# Patient Record
Sex: Female | Born: 1970 | Race: White | Hispanic: No | Marital: Married | Smoking: Never smoker
Health system: Southern US, Community
[De-identification: ages and names within clinical notes are randomized; demographics above are authoritative.]

---

## 1997-08-16 ENCOUNTER — Other Ambulatory Visit: Admission: RE | Admit: 1997-08-16 | Discharge: 1997-08-16 | Payer: Self-pay | Admitting: Obstetrics and Gynecology

## 1998-03-09 ENCOUNTER — Inpatient Hospital Stay (HOSPITAL_COMMUNITY): Admission: AD | Admit: 1998-03-09 | Discharge: 1998-03-12 | Payer: Self-pay | Admitting: Obstetrics and Gynecology

## 1998-03-22 ENCOUNTER — Encounter (HOSPITAL_COMMUNITY): Admission: RE | Admit: 1998-03-22 | Discharge: 1998-06-20 | Payer: Self-pay | Admitting: *Deleted

## 1998-04-16 ENCOUNTER — Other Ambulatory Visit: Admission: RE | Admit: 1998-04-16 | Discharge: 1998-04-16 | Payer: Self-pay | Admitting: *Deleted

## 1999-06-16 ENCOUNTER — Other Ambulatory Visit: Admission: RE | Admit: 1999-06-16 | Discharge: 1999-06-16 | Payer: Self-pay | Admitting: Obstetrics and Gynecology

## 2000-07-09 ENCOUNTER — Other Ambulatory Visit: Admission: RE | Admit: 2000-07-09 | Discharge: 2000-07-09 | Payer: Self-pay | Admitting: Obstetrics and Gynecology

## 2000-09-26 ENCOUNTER — Encounter: Payer: Self-pay | Admitting: Family Medicine

## 2000-09-26 ENCOUNTER — Encounter: Admission: RE | Admit: 2000-09-26 | Discharge: 2000-09-26 | Payer: Self-pay | Admitting: Family Medicine

## 2000-10-18 ENCOUNTER — Encounter: Payer: Self-pay | Admitting: Neurosurgery

## 2000-10-18 ENCOUNTER — Inpatient Hospital Stay (HOSPITAL_COMMUNITY): Admission: RE | Admit: 2000-10-18 | Discharge: 2000-10-19 | Payer: Self-pay | Admitting: Neurosurgery

## 2001-05-17 ENCOUNTER — Other Ambulatory Visit: Admission: RE | Admit: 2001-05-17 | Discharge: 2001-05-17 | Payer: Self-pay | Admitting: Obstetrics and Gynecology

## 2001-10-19 ENCOUNTER — Ambulatory Visit (HOSPITAL_COMMUNITY): Admission: RE | Admit: 2001-10-19 | Discharge: 2001-10-19 | Payer: Self-pay | Admitting: Gastroenterology

## 2001-10-19 ENCOUNTER — Encounter: Payer: Self-pay | Admitting: Gastroenterology

## 2001-11-24 ENCOUNTER — Observation Stay (HOSPITAL_COMMUNITY): Admission: RE | Admit: 2001-11-24 | Discharge: 2001-11-25 | Payer: Self-pay | Admitting: Surgery

## 2001-11-24 ENCOUNTER — Encounter (INDEPENDENT_AMBULATORY_CARE_PROVIDER_SITE_OTHER): Payer: Self-pay | Admitting: Specialist

## 2001-11-24 ENCOUNTER — Encounter: Payer: Self-pay | Admitting: Surgery

## 2002-01-09 ENCOUNTER — Other Ambulatory Visit: Admission: RE | Admit: 2002-01-09 | Discharge: 2002-01-09 | Payer: Self-pay | Admitting: Obstetrics and Gynecology

## 2002-05-25 ENCOUNTER — Ambulatory Visit (HOSPITAL_COMMUNITY): Admission: RE | Admit: 2002-05-25 | Discharge: 2002-05-25 | Payer: Self-pay | Admitting: Gastroenterology

## 2002-05-25 ENCOUNTER — Encounter: Payer: Self-pay | Admitting: Gastroenterology

## 2002-08-15 ENCOUNTER — Other Ambulatory Visit: Admission: RE | Admit: 2002-08-15 | Discharge: 2002-08-15 | Payer: Self-pay | Admitting: Obstetrics and Gynecology

## 2003-08-28 ENCOUNTER — Other Ambulatory Visit: Admission: RE | Admit: 2003-08-28 | Discharge: 2003-08-28 | Payer: Self-pay | Admitting: Obstetrics and Gynecology

## 2004-09-02 ENCOUNTER — Other Ambulatory Visit: Admission: RE | Admit: 2004-09-02 | Discharge: 2004-09-02 | Payer: Self-pay | Admitting: Obstetrics and Gynecology

## 2004-10-23 ENCOUNTER — Encounter: Admission: RE | Admit: 2004-10-23 | Discharge: 2004-10-23 | Payer: Self-pay | Admitting: Obstetrics and Gynecology

## 2004-12-08 ENCOUNTER — Encounter: Admission: RE | Admit: 2004-12-08 | Discharge: 2004-12-08 | Payer: Self-pay | Admitting: Otolaryngology

## 2005-03-27 ENCOUNTER — Encounter: Admission: RE | Admit: 2005-03-27 | Discharge: 2005-03-27 | Payer: Self-pay | Admitting: Otolaryngology

## 2006-05-24 ENCOUNTER — Encounter: Admission: RE | Admit: 2006-05-24 | Discharge: 2006-05-24 | Payer: Self-pay | Admitting: Family Medicine

## 2007-02-04 ENCOUNTER — Ambulatory Visit (HOSPITAL_COMMUNITY): Admission: RE | Admit: 2007-02-04 | Discharge: 2007-02-04 | Payer: Self-pay | Admitting: Obstetrics and Gynecology

## 2008-01-26 ENCOUNTER — Encounter: Admission: RE | Admit: 2008-01-26 | Discharge: 2008-01-26 | Payer: Self-pay | Admitting: Family Medicine

## 2009-01-24 ENCOUNTER — Encounter: Admission: RE | Admit: 2009-01-24 | Discharge: 2009-01-24 | Payer: Self-pay | Admitting: *Deleted

## 2010-05-20 NOTE — H&P (Signed)
[name], [name]              ACCOUNT NO.:  [id number]   MEDICAL RECORD NO.:  [id number]          PATIENT TYPE:  AMB   LOCATION:  SDC                           FACILITY:  [facility]   PHYSICIAN:  Duke Salvia. Marcelle Overlie, M.D.DATE OF BIRTH:  [birth year]   DATE OF ADMISSION:  DATE OF DISCHARGE:                              HISTORY & PHYSICAL   DATE OF SCHEDULED SURGERY:  February 04, 2007, at [facility].   CHIEF COMPLAINT:  1. Pelvic pain.  2. Dyspareunia.   HPI:  A 40 year old G1, P1 is currently on Loestrin 24 Fe.  She has a  long history of vague pelvic pain and worsening dyspareunia that has  become more pronounced over the last 6 months despite being on oral  contraceptives.  Of note, she has a sister who has endometriosis.  Due  to continued problems with pain, she presents now for diagnostic  laparoscopy.  This procedure including risk of bleeding, infection,  adjacent organ injury, possible need for open or additional surgery all  reviewed with her which she understands and accepts.  Laparoscopic  treatments and hormonal treatments for endometriosis likewise reviewed.   PAST MEDICAL HISTORY:   CURRENT MEDICATIONS:  1. Topamax.  2. Effexor.  3. Valtrex p.r.n.  4. Loestrin Fe.  5. Vicoprofen p.r.n. 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  She has a history of  migraine headache.   FAMILY HISTORY:  Significant for grandmother who has had a stroke.   PHYSICAL EXAM:  Temperature 98.2.  Blood pressure 128/70.  HEENT:  Unremarkable.  NECK:  Supple without masses.  LUNGS:  Clear.  CARDIOVASCULAR:  Regular rate and rhythm without murmurs, rubs, or  gallops.  BREASTS:  Without masses.  ABDOMEN:  Soft, flat, and nontender.  PELVIC EXAM:  Normal external genitalia.  Vagina, cervix clear.  Uterus  normal position, normal size.  Adnexa negative.  No unusual nodularity  or tenderness.   IMPRESSION:  1. Chronic pelvic pain.  2. Dyspareunia.   PLAN:  Diagnostic laparoscopy procedure and risks reviewed as above.      Richard M. Marcelle Overlie, M.D.  Electronically Signed     RMH/MEDQ  D:  01/29/2007  T:  01/30/2007  Job:  [id number]

## 2010-05-20 NOTE — Op Note (Signed)
[name], [name]              ACCOUNT NO.:  [id number]   MEDICAL RECORD NO.:  [id number]          PATIENT TYPE:  AMB   LOCATION:  SDC                           [facility]  [facility]   PHYSICIAN:  Duke Salvia. Marcelle Overlie, M.D.DATE OF BIRTH:  02-15-1970   DATE OF PROCEDURE:  02/04/2007  DATE OF DISCHARGE:                               OPERATIVE REPORT   PREOPERATIVE DIAGNOSIS:  Pelvic pain, dyspareunia.   POSTOPERATIVE DIAGNOSIS:  Pelvic pain, dyspareunia, possible minimal  left uterosacral endometriosis, right mid and upper abdomen adhesions  secondary to her prior cholecystectomy.   SURGEON:  Duke Salvia. Marcelle Overlie, M.D.   ANESTHESIA:  General.   COMPLICATIONS:  None.   DRAINS:  In and out Foley catheter.   BLOOD LOSS:  Minimal.   SPECIMENS REMOVED:  None.   PROCEDURE AND FINDINGS:  The patient was taken to the operating room.  After an adequate level of general anesthesia was obtained with the  patient's legs in stirrups the abdomen, perineum and vagina were prepped  and draped in the usual manner for laparoscopy.  The bladder was  drained.  EUA carried out.  Uterus mid position, normal size, mobile,  adnexa negative.  Hulka tenaculum positioned.  Attention directed to the  abdomen.  The subumbilical area was infiltrated with half percent  Marcaine plain.  A small incision was made.  The Veress needle  introduced without difficulty.  Its intra-abdominal position verified by  pressure and water testing.  After a 2.5 liter pneumoperitoneum was then  created laparoscopic trocar and sleeve were then introduced without  difficulty.  There was no evidence of any bleeding or trauma.   Three fingerbreadths above the symphysis in the midline a 5 mm trocar  was inserted and a blunt probe was positioned.  The patient was then  placed in Trendelenburg and the pelvic findings as follows.  The  anterior and posterior cul-de-sac were unremarkable.  The uterus itself  was normal size.  The serosa  was normal.  Bilateral adnexal and broad  ligament areas were unremarkable.  There was one peritoneal window  lateral to the left uterosacral ligament that may have represented an  area of old minimal endometriosis but no other active disease noted.  The appendix was unremarkable.  The upper abdomen did have adhesions  from omentum and small bowel to the right mid and right upper abdomen  from her prior laparoscopic cholecystectomy.  This area of adhesions was  extensive and was not lysed.   The pelvic findings were photo documented, instruments were removed, gas  allowed to escape.  Defects were closed with 4-0 Dexon subcuticular  sutures and Dermabond.  She tolerated this well, went to recovery room  in good condition.      Richard M. Marcelle Overlie, M.D.  Electronically Signed     RMH/MEDQ  D:  02/04/2007  T:  02/04/2007  Job:  [id number]

## 2010-05-23 NOTE — Op Note (Signed)
[name], [name]                        ACCOUNT NO.:  [id number]   MEDICAL RECORD NO.:  [id number]                   PATIENT TYPE:  AMB   LOCATION:  DAY                                  FACILITY:  [facility]   PHYSICIAN:  Currie Paris, M.D.           DATE OF BIRTH:  Jul 24, 1970   DATE OF PROCEDURE:  11/24/2001  DATE OF DISCHARGE:                                 OPERATIVE REPORT   CCS#:  [id number]   PREOPERATIVE DIAGNOSES:  Biliary dyskinesia.   POSTOPERATIVE DIAGNOSES:  Biliary dyskinesia.   OPERATION:  Laparoscopic cholecystectomy with operative cholangiogram.   SURGEON:  Currie Paris, M.D.   ASSISTANT:  Lorelee New, M.D.   ANESTHESIA:  General endotracheal.   CLINICAL HISTORY:  This patient is a 40 year old with multiple GI symptoms  but has recently been having biliary type symptoms and workup showed  markedly abnormal ejection fraction with CCK injection and we elected to  proceed to a laparoscopic cholecystectomy.   DESCRIPTION OF PROCEDURE:  The patient was seen in the holding area and had  no further questions. She was taken to the operating room and after  satisfactory general anesthesia had been obtained, the abdomen was prepped  and draped. The 0.25% plain Marcaine was used for local and the umbilical  incision was made, the fascia opened and the peritoneal cavity entered under  direct vision. A pursestring was placed, the Hasson introduced and the  abdomen insufflated to 15.   At the patient's request, we took a peak at the pelvic area and could see  what appeared to be normal upper surface of the uterus as well as what  appeared to be a normal left ovary but I could not really visualize the  right ovary as the cecum was lying down and we could not well mobilize that  off. There was no gross evidence of endometriosis but there was a small  amount of free blood in the pelvis which could have been preexisting or  could have been a small amount of  leakage from the umbilical incision as we  were entering. This was probably in the order of about 10 cc.   Additional cannulas were placed and the patient put in reverse  Trendelenburg. The peritoneum over the cystic duct was opened and there was  what appeared to be a little bit of a bigger size cystic duct and we could  easily see the common duct. I made a big window and could see the anterior  branch of the cystic artery coming up on the gallbladder and it freed up the  peritoneum on either side of the gallbladder so I got good mobility. Once I  had adequate length on the cystic duct, I put a clip on it near the junction  of the gallbladder and opened it for the cholangiogram. The duct wall itself  was markedly thickened with a relatively small lumen. I was able  to  cannulate it fairly readily and operative cholangiography was done. To me  the cholangiogram appeared normal with filling of the hepatic ducts, a  fairly long cystic duct and filling into the duodenum although it did look a  little tight at the distal common duct. There were no filling defects noted.   The cystic duct catheter was removed and four clips placed on the cystic  duct and it was divided. The anterior branch of the cystic artery had clips  placed on it and it was divided with two clips on the stay side. A little  further work identified the posterior branch of the cystic artery which was  clipped in a similar fashion and divided and the gallbladder then removed  from below to above with coagulation current of the cautery.   Just prior to disconnecting, we irrigated and made sure everything was dry  and then disconnected the gallbladder and brought it out the umbilical port.   The abdomen was reinsufflated and final irrigation and check for hemostasis  was made and again everything appeared dry. The lateral ports were removed  under direct vision, there was no bleeding. The umbilical port was removed  and the  pursestring tied down leaving the camera in the epigastric port to  be sure that that went okay. The abdomen was deflated through the epigastric  port and all skin incisions were then closed with 4-0 Monocryl subcuticular  plus Steri-Strips.   The patient tolerated the procedure well. There were no operative  complications. All counts were correct.                                                Currie Paris, M.D.    CJS/MEDQ  D:  11/24/2001  T:  11/24/2001  Job:  [id number]   cc:   Vale Haven. Andrey Campanile, M.D.  [address]  [place]  [place] [address]  Fax: [phone]   Vania Rea. Jarold Motto, M.D. [facility]  [address]  [place]  [place] [address]  Fax: [phone]

## 2010-05-23 NOTE — Op Note (Signed)
[facility]  Patient:    LUS, KRIEGEL Visit Number: [id number] MRN: [id number]          Service Type: SUR Location: [facility] 3172 02 Attending Physician:  Barton Fanny Dictated by:   Hewitt Shorts, M.D. Proc. Date: 10/18/00 Admit Date:  10/18/2000                             Operative Report  PREOPERATIVE DIAGNOSIS:  Right L5-S1 lumbar disk herniation.  POSTOPERATIVE DIAGNOSIS:  Right L5-S1 lumbar disk herniation.  OPERATION PERFORMED:  Right L5-S1 lumbar laminotomy and microdiskectomy with microsurgery and microdissection.  SURGEON:  Hewitt Shorts, M.D.  ANESTHESIA:  General endotracheal.  INDICATIONS FOR PROCEDURE:  The patient is a 40 year old woman who presented with a right lumbar radiculopathy and was found by MRI scan to have a large central to the right L5-S1 lumbar disk herniation.  Decision was made to proceed with elective laminotomy and microdiskectomy.  DESCRIPTION OF PROCEDURE:  The patient was brought to the operating room and placed under general endotracheal anesthesia.  The patient was turned to a prone position and lumbar region was prepped with Betadine soap and solution and draped in sterile fashion.  The midline was infiltrated with local anesthetic with epinephrine and localizing x-ray was taken.  The L5-S1 level identified and a midline incision made over the L5-S1 level and carried down through the subcutaneous tissues.  Bipolar cautery and electrocautery were used to maintain hemostasis.  Dissection was carried down to the level of the lumbar fascia which was incised on the right side of the midline and the paraspinal muscles were resected from the spinous process of the lamina in a subperiosteal fashion.  Another x-ray was taken and the L5-S1 interlaminar space identified and then a laminotomy performed using the [facility] Max drill, Kerrison punches and then the ligamentum flavum was  carefully  resected.  The microscope was draped and brought into the field to provide additional magnification, illumination and visualization.  The remainder of the procedure was performed using microdissection and microsurgical technique. The ligamentum flavum was removed exposing the thecal sac and  right S1 nerve root.  These were gently retracted medially exposing a large disk herniation. The remaining annular fibers were incised and the large fragment of disk that had herniated was removed.  We then entered the disk space and removed additional degenerated disk material.  Using a variety of pituitary rongeurs we performed a thorough diskectomy, removed all loose fragments of disk material from both the disk space and the epidural space.  Once the diskectomy was completed, hemostasis was established with the use of bipolar cautery as well as Gelfoam soaked in thrombin.  All the Gelfoam was removed, though, prior to closure.  Once hemostasis was established and diskectomy completed. We instilled 2 cc of fentanyl and 80 mg of Depo-Medrol into the epidural space and then proceeded with closure. The deep fascia closed with interrupted 0 undyed Vicryl sutures and the subcutaneous and subcuticular layer closed with interrupted inverted 2-0 undyed Vicryl sutures and the skin edges reapproximated with Dermabond.  The patient tolerated the procedure well. Estimated blood loss for this procedure was 25 cc.  Sponge and instrument counts were correct.  Following surgery, the patient was turned back to the supine position to be reversed from anesthetic, extubated and transferred to the recovery room for further care. Dictated by:   Hewitt Shorts, M.D. Attending  Physician:  Barton Fanny DD:  10/18/00 TD:  10/18/00 Job: [phone] [id number]

## 2010-09-25 LAB — CBC
HCT: 41.9
Hemoglobin: 14.7
MCV: 87.3
Platelets: 259
RBC: 4.79
WBC: 6.1

## 2011-09-02 DIAGNOSIS — R109 Unspecified abdominal pain: Secondary | ICD-10-CM | POA: Insufficient documentation

## 2012-12-22 ENCOUNTER — Other Ambulatory Visit: Payer: Self-pay | Admitting: Dermatology

## 2013-09-08 ENCOUNTER — Other Ambulatory Visit: Payer: Self-pay | Admitting: Internal Medicine

## 2013-09-08 DIAGNOSIS — R1900 Intra-abdominal and pelvic swelling, mass and lump, unspecified site: Secondary | ICD-10-CM

## 2013-09-08 DIAGNOSIS — R10814 Left lower quadrant abdominal tenderness: Secondary | ICD-10-CM

## 2013-09-19 ENCOUNTER — Ambulatory Visit
Admission: RE | Admit: 2013-09-19 | Discharge: 2013-09-19 | Disposition: A | Discharge status: Home / Self Care | Payer: BC Managed Care – PPO | Source: Ambulatory Visit | Attending: Internal Medicine | Admitting: Internal Medicine

## 2013-09-19 DIAGNOSIS — R10814 Left lower quadrant abdominal tenderness: Secondary | ICD-10-CM

## 2013-09-19 DIAGNOSIS — R1900 Intra-abdominal and pelvic swelling, mass and lump, unspecified site: Secondary | ICD-10-CM

## 2013-11-06 ENCOUNTER — Other Ambulatory Visit: Payer: Self-pay | Admitting: Obstetrics and Gynecology

## 2013-11-07 DIAGNOSIS — K581 Irritable bowel syndrome with constipation: Secondary | ICD-10-CM | POA: Insufficient documentation

## 2013-11-07 DIAGNOSIS — K599 Functional intestinal disorder, unspecified: Secondary | ICD-10-CM | POA: Insufficient documentation

## 2013-11-07 LAB — CYTOLOGY - PAP

## 2014-02-09 ENCOUNTER — Other Ambulatory Visit: Payer: Self-pay | Admitting: Dermatology

## 2014-07-13 ENCOUNTER — Other Ambulatory Visit: Payer: Self-pay | Admitting: Family Medicine

## 2014-07-13 DIAGNOSIS — R519 Headache, unspecified: Secondary | ICD-10-CM

## 2014-07-13 DIAGNOSIS — R51 Headache: Principal | ICD-10-CM

## 2014-07-16 ENCOUNTER — Inpatient Hospital Stay
Admission: RE | Admit: 2014-07-16 | Discharge: 2014-07-16 | Disposition: A | Discharge status: Home / Self Care | Payer: Self-pay | Source: Ambulatory Visit | Attending: Family Medicine | Admitting: Family Medicine

## 2014-07-17 ENCOUNTER — Other Ambulatory Visit: Payer: Self-pay | Admitting: Family Medicine

## 2014-07-17 ENCOUNTER — Ambulatory Visit
Admission: RE | Admit: 2014-07-17 | Discharge: 2014-07-17 | Disposition: A | Discharge status: Home / Self Care | Payer: BLUE CROSS/BLUE SHIELD | Source: Ambulatory Visit | Attending: Family Medicine | Admitting: Family Medicine

## 2014-07-17 DIAGNOSIS — R51 Headache: Principal | ICD-10-CM

## 2014-07-17 DIAGNOSIS — R519 Headache, unspecified: Secondary | ICD-10-CM

## 2014-09-07 ENCOUNTER — Ambulatory Visit (INDEPENDENT_AMBULATORY_CARE_PROVIDER_SITE_OTHER): Payer: BLUE CROSS/BLUE SHIELD | Admitting: Sports Medicine

## 2014-09-07 ENCOUNTER — Encounter: Payer: Self-pay | Admitting: Sports Medicine

## 2014-09-07 VITALS — BP 116/69 | Ht 63.0 in | Wt 118.0 lb

## 2014-09-07 DIAGNOSIS — M79672 Pain in left foot: Secondary | ICD-10-CM

## 2014-09-07 DIAGNOSIS — M7742 Metatarsalgia, left foot: Secondary | ICD-10-CM | POA: Diagnosis not present

## 2014-09-07 DIAGNOSIS — M25579 Pain in unspecified ankle and joints of unspecified foot: Secondary | ICD-10-CM | POA: Insufficient documentation

## 2014-09-07 NOTE — Progress Notes (Signed)
  [name] - 44 y.o. female MRN [id number]  Date of birth: 05-03-1970  CC: Left Foot Pain, Runner  SUBJECTIVE:   HPI  Left foot pain at the head of the 2nd metatarsal: - began 2 weeks ago.   - 1/2 marathon in upcoming in October. This will be her 1st 1/2 marathon. - Currently running 20-67miles/week - Began running 1 year ago with a couch-to- 5k program - 10k in July  - Tried changing shoes about a week ago, but no improvement.    - changed from 12mm drop to 8mm...thinks it may have remained pain free for a longer period.  - Pain only after 5 miles, not initially - No swelling in forefoot.  - Worst is 8/10, sharp. Does not have to stop runnin.  - No radiation to toes - Has been taking ibuprofen  Bilateral ankle pain: - Began within the last week with new shoes - Ankle pain feels like the same bilaterally - No specific location, just sore  Denies fevers, chills, or night sweats  ROS:     11 point RoS negative other than that listed in HPI.   HISTORY: Past Medical, Surgical, Social, and Family History Reviewed & Updated per EMR.  Pertinent Historical Findings include: none  OBJECTIVE: Ht  (1.6 m)  Wt 118 lb (53.524 kg)  BMI 20.91 kg/m2  Physical Exam  NAD, Calm  Non-labored breathing, speaking in full sentences. Ankle: b/l No visible erythema or swelling. Range of motion is full in all directions. Strength is 5/5 in all directions. Stable lateral and medial ligaments.  Talar dome nontender. No pain at base of 5th MT No tenderness over the navicular prominence No tenderness on posterior aspects of lateral and medial malleolus No sign of peroneal tendon subluxations; No pain with running.   Stance: neutral arch.   Foot inspection and palpation does not suggest breakdown of the transverse arch or drop of MT heads: Abnormal callous is present: minimal over base of 1st and 2nd MT Hammer toes:none TTP: 2nd & 3rd MT heads Negative squeeze test. Gait: Well  aligned neutral gait. Negative trendelenburg. Heel striker.  Hard landing.   Assessment of hip abductor strength revealed 4+/5 b/l.    Ultrasound: long and short axis views of the 2nd & 3rd metatarsal heads on both the plantar and dorsal aspect are negative for cortical irregularity or edema adjacent to the periosteum.  2nd MTP does have minimally increased fluid, but capsule does not appear distended and there is no joint contour irregularity.     MEDICATIONS, LABS & OTHER ORDERS: Previous Medications   No medications on file   Modified Medications   No medications on file   New Prescriptions   No medications on file   Discontinued Medications   No medications on file  No orders of the defined types were placed in this encounter.   ASSESSMENT & PLAN: See problem based charting & AVS for pt instructions.

## 2014-09-07 NOTE — Assessment & Plan Note (Addendum)
44 yo relatively new runner who training for a 1/2 marathon, now with 2 weeks of left forefoot pain suggestive of metatarsalgia. History not suggestive of stress fx. Ultrasound negative.  - Patient provided with W Size 6-7 green insoles with small metatarsal pads - Patient okay to continue current training routine.  -  F/u in 4 weeks unless having no pain.  Patient to call back earlier if having worsening pain.

## 2014-09-07 NOTE — Assessment & Plan Note (Signed)
Minimal pain b/l ankles for a few days since changing shoes.  No point tenderness. Likely sore from weak stabilizing muscles and increase in mileage.

## 2014-10-09 ENCOUNTER — Ambulatory Visit (INDEPENDENT_AMBULATORY_CARE_PROVIDER_SITE_OTHER): Payer: BLUE CROSS/BLUE SHIELD | Admitting: Sports Medicine

## 2014-10-09 ENCOUNTER — Encounter: Payer: Self-pay | Admitting: Sports Medicine

## 2014-10-09 VITALS — BP 116/73 | HR 84 | Ht 63.0 in | Wt 118.0 lb

## 2014-10-09 DIAGNOSIS — M722 Plantar fascial fibromatosis: Secondary | ICD-10-CM | POA: Diagnosis not present

## 2014-10-09 DIAGNOSIS — M7742 Metatarsalgia, left foot: Secondary | ICD-10-CM

## 2014-10-09 NOTE — Progress Notes (Signed)
   Subjective:    Patient ID: [name], female    DOB: March 11, 1970, 44 y.o.   MRN: [id number]  HPI44 y/o female who presents for follow up of left foot pain.  She was diagnosed with metatarsalgia after starting to train for a half marathon in a week and a half.  She was given new inserts with a metatarsal pad in both shoes to help with the pain.  She has not noticed a difference and has actually noticed a blister on the right foot.  She is currently running 25 miles/week and the most she will run is 13 miles in a stretch.  She notices the pain after mile 7 and has the pain no other time.  She denies swelling.  She has also noticed pain near her heels in her bilateral feet since she started training.  It occurs when she first steps down after waking up in the morning.  She has been rolling a frozen water bottle under her bilateral feet with some relief.  The pain goes away after walking on it for a while in the morning.      Review of Systems Gen: Denies fevers, chills MSK: +foot pain, no joint swelling    Objective:   Physical Exam  Constitutional: She is oriented to person, place, and time. She appears well-developed and well-nourished.  Neurological: She is alert and oriented to person, place, and time.  Psychiatric: She has a normal mood and affect. Her behavior is normal. Judgment and thought content normal.   MSK:  Inspection- flexible cavus foot. Transverse arch collapse with separation of the first and second toes bilaterally, right greater than left.   Palpation- TTP along 2nd metatarsal head on left foot, TTP at the calcaneal origin of the plantar fascia. Negative calcaneal squeeze. No TTP with ligaments of ankle or other metatarsal heads   ROM- full ROM of ankles b/l in dorsiflexion and plantarflexion, inversion, eversion of hindfoot   Muscle strength- 5/5 muscle strength of b/l ankles   Neurovascular- dorsalis pedis and posterior tibialis pulses intact, sensation intact           Assessment & Plan:  Metatarsalgia- unchanged from last visit, but still only occuring in the middle of a lengthy run.  Increased padding under metatarsals of left insert using Poron metatarsal bar.  Removed metatarsal pad from right insert.  Recommend rest from running after half marathon next weekend and begin cross training with swimming or biking, etc.    Plantar fasciitis- Recommend ice baths for heels, but can continue rolling foot on frozen water bottle if cannot tolerate ice baths.  Stretches demonstrated in office and handouts given for exercises.  Patient instructed to perform daily.  Follow up after half marathon.  Patient would likely benefit from custom orthotics in the near future.

## 2014-11-12 ENCOUNTER — Ambulatory Visit (INDEPENDENT_AMBULATORY_CARE_PROVIDER_SITE_OTHER): Payer: BLUE CROSS/BLUE SHIELD | Admitting: Sports Medicine

## 2014-11-12 ENCOUNTER — Ambulatory Visit (HOSPITAL_COMMUNITY)
Admission: RE | Admit: 2014-11-12 | Discharge: 2014-11-12 | Disposition: A | Discharge status: Home / Self Care | Payer: BLUE CROSS/BLUE SHIELD | Source: Ambulatory Visit | Attending: Sports Medicine | Admitting: Sports Medicine

## 2014-11-12 ENCOUNTER — Encounter: Payer: Self-pay | Admitting: Sports Medicine

## 2014-11-12 VITALS — BP 115/76 | HR 77 | Ht 63.0 in | Wt 118.0 lb

## 2014-11-12 DIAGNOSIS — M79605 Pain in left leg: Secondary | ICD-10-CM

## 2014-11-12 DIAGNOSIS — M7989 Other specified soft tissue disorders: Secondary | ICD-10-CM

## 2014-11-12 DIAGNOSIS — M722 Plantar fascial fibromatosis: Secondary | ICD-10-CM | POA: Diagnosis not present

## 2014-11-12 DIAGNOSIS — M79662 Pain in left lower leg: Secondary | ICD-10-CM

## 2014-11-12 NOTE — Progress Notes (Signed)
Subjective:    Patient ID: [name], female    DOB: 08/15/1970, 44 y.o.   MRN: [id number]  HPI 44 year old female presenting for follow-up of left greater than right metatarsalgia. She has since completed the [place] half marathon and ran a 2 hour and 18 minute race. She reports that during a race and with subsequent training/return to running her metatarsalgia has migrated through the foot and now is located at the heel.  She developed a large blister on the right medial longitudinal arch/medial portion of distal foot. She has not had any weakness, instability or swelling in either leg.  However, will discussing with coworkers she and several coworkers noticed that her left calf was slightly larger than her right. She reports that this started shortly after the race. She complains of some tenderness in the medial calf. She denies any shortness of breath, swelling distal to the calf, chest pain, pleurisy or any other symptoms.  She confirms that she is a nonsmoker and states that she has been off of birth control for over 5 months. She has no family or personal history of venous thromboembolism.  Past medical history, past surgical history, medications, allergies and social history were reviewed and updated in the EMR.  She took 2 weeks off of running after the race; she has since resumed her prior baseline running schedule of about 15-18 miles per week.   Review of Systems 10 point review of systems was negative other than detailed above     Objective:   Physical Exam  General: -- Well appearing and in NAD; resting comfortably on exam table  Cardiopulmonary: -- Non-labored respirations -- No JVD; 2+ pulses in distal upper and lower extremities  Neurology: -- No focal deficits on interview/exam -- Neurovascular intact in examined bilateral lower extremities  Psych: -- Appropriate mood/affect; normal thought content  Bilateral leg and foot exam: -On inspection  there is a significantly prominent bilateral cavus foot -Patient is tender to palpation at the insertion of her LEFT Achilles tendon; she is tender to palpation of her BILATERAL origin of the plantar fascia -She is nontender to palpation of the navicular, cuboid, calcaneus and base of the fifth metatarsal -5/5 strength in dorsiflexion and plantar flexion of the bilateral ankles; 5/5 strength in extension and flexion of the great toe -Negative anterior drawer and talar tilt bilaterally -Negative Thompson test bilaterally  -The LEFT calf is grossly large relative to the right on inspection -The LEFT calf has a circumference of 33.5 cm approximately 5 cm distal to the tibial tuberosity; this is 1.5 cm larger than the RIGHT calf at the same level relative to the tibial tuberosity -Tender to palpation along the medial aspect of the medial gastrocnemius of the LEFT calf; nontender on the right   Patient was fitted for a : standard, cushioned, semi-rigid orthotic. The orthotic was heated and afterward the patient stood on the orthotic blank positioned on the orthotic stand. The patient was positioned in subtalar neutral position and 10 degrees of ankle dorsiflexion in a weight bearing stance. After completion of molding, a stable base was applied to the orthotic blank. The blank was ground to a stable position for weight bearing. Size: 7 Base: BLUE EVA Posting: None Additional orthotic padding: None      Assessment & Plan:   44 year old female presented for follow-up of bilateral metatarsalgia that is now migrated into plantar fasciitis and some mild Achilles tendinopathy. Symptoms are both present bilaterally but more prominent on the  left leg.  #1. Bilateral cavus foot, metatarsalgia and plantar fasciitis  #2. Enlarged LEFT lower extremity   Plan: -LEFT lower extremity Doppler to definitively rule out DVT -Orthotics made today, continue to wear in her athletic shoes with all activity  other than walking around the home and when wearing dress shoes at work -While recovering from plantar fasciitis and Achilles tendinopathy, recommend avoiding large heels while at work in her dress shoes -May use Tylenol/NSAIDs as needed for pain coverage -Follow up in sports medicine as needed for any residual Achilles tendinopathy or plantar fasciitis that does not resolve with stretching and strengthening program provided to patient today and her permanent orthotics. I will call her with results of her Doppler once available.

## 2014-11-12 NOTE — Progress Notes (Signed)
VASCULAR LAB PRELIMINARY  PRELIMINARY  PRELIMINARY  PRELIMINARY  Left lower extremity venous duplex completed.    Preliminary report:  Left:  No evidence of DVT, superficial thrombosis, or Baker's cyst.  Damyra Luscher, RVT 11/12/2014, 5:28 PM

## 2014-11-14 ENCOUNTER — Telehealth: Payer: Self-pay | Admitting: Sports Medicine

## 2014-11-14 NOTE — Telephone Encounter (Signed)
Patient notified that Doppler US was negative for DVT.

## 2014-11-20 ENCOUNTER — Other Ambulatory Visit: Payer: Self-pay | Admitting: Obstetrics and Gynecology

## 2014-11-20 DIAGNOSIS — R928 Other abnormal and inconclusive findings on diagnostic imaging of breast: Secondary | ICD-10-CM

## 2014-11-26 ENCOUNTER — Ambulatory Visit
Admission: RE | Admit: 2014-11-26 | Discharge: 2014-11-26 | Disposition: A | Discharge status: Home / Self Care | Payer: BLUE CROSS/BLUE SHIELD | Source: Ambulatory Visit | Attending: Obstetrics and Gynecology | Admitting: Obstetrics and Gynecology

## 2014-11-26 DIAGNOSIS — R928 Other abnormal and inconclusive findings on diagnostic imaging of breast: Secondary | ICD-10-CM

## 2015-04-08 DIAGNOSIS — D2261 Melanocytic nevi of right upper limb, including shoulder: Secondary | ICD-10-CM | POA: Diagnosis not present

## 2015-04-08 DIAGNOSIS — C44519 Basal cell carcinoma of skin of other part of trunk: Secondary | ICD-10-CM | POA: Diagnosis not present

## 2015-04-08 DIAGNOSIS — D225 Melanocytic nevi of trunk: Secondary | ICD-10-CM | POA: Diagnosis not present

## 2015-04-08 DIAGNOSIS — D1801 Hemangioma of skin and subcutaneous tissue: Secondary | ICD-10-CM | POA: Diagnosis not present

## 2015-04-08 DIAGNOSIS — L918 Other hypertrophic disorders of the skin: Secondary | ICD-10-CM | POA: Diagnosis not present

## 2015-04-08 DIAGNOSIS — D485 Neoplasm of uncertain behavior of skin: Secondary | ICD-10-CM | POA: Diagnosis not present

## 2015-04-23 DIAGNOSIS — M722 Plantar fascial fibromatosis: Secondary | ICD-10-CM | POA: Diagnosis not present

## 2015-04-23 DIAGNOSIS — M9903 Segmental and somatic dysfunction of lumbar region: Secondary | ICD-10-CM | POA: Diagnosis not present

## 2015-04-23 DIAGNOSIS — M79672 Pain in left foot: Secondary | ICD-10-CM | POA: Diagnosis not present

## 2015-04-23 DIAGNOSIS — M5386 Other specified dorsopathies, lumbar region: Secondary | ICD-10-CM | POA: Diagnosis not present

## 2015-04-25 DIAGNOSIS — M79672 Pain in left foot: Secondary | ICD-10-CM | POA: Diagnosis not present

## 2015-04-25 DIAGNOSIS — M5386 Other specified dorsopathies, lumbar region: Secondary | ICD-10-CM | POA: Diagnosis not present

## 2015-04-25 DIAGNOSIS — M722 Plantar fascial fibromatosis: Secondary | ICD-10-CM | POA: Diagnosis not present

## 2015-04-25 DIAGNOSIS — M9903 Segmental and somatic dysfunction of lumbar region: Secondary | ICD-10-CM | POA: Diagnosis not present

## 2015-05-06 DIAGNOSIS — M79672 Pain in left foot: Secondary | ICD-10-CM | POA: Diagnosis not present

## 2015-05-06 DIAGNOSIS — M5386 Other specified dorsopathies, lumbar region: Secondary | ICD-10-CM | POA: Diagnosis not present

## 2015-05-06 DIAGNOSIS — M9903 Segmental and somatic dysfunction of lumbar region: Secondary | ICD-10-CM | POA: Diagnosis not present

## 2015-05-06 DIAGNOSIS — M722 Plantar fascial fibromatosis: Secondary | ICD-10-CM | POA: Diagnosis not present

## 2015-05-09 DIAGNOSIS — M9903 Segmental and somatic dysfunction of lumbar region: Secondary | ICD-10-CM | POA: Diagnosis not present

## 2015-05-09 DIAGNOSIS — M79672 Pain in left foot: Secondary | ICD-10-CM | POA: Diagnosis not present

## 2015-05-09 DIAGNOSIS — M5386 Other specified dorsopathies, lumbar region: Secondary | ICD-10-CM | POA: Diagnosis not present

## 2015-05-09 DIAGNOSIS — M722 Plantar fascial fibromatosis: Secondary | ICD-10-CM | POA: Diagnosis not present

## 2015-05-30 DIAGNOSIS — M722 Plantar fascial fibromatosis: Secondary | ICD-10-CM | POA: Diagnosis not present

## 2015-06-07 DIAGNOSIS — M79672 Pain in left foot: Secondary | ICD-10-CM | POA: Diagnosis not present

## 2015-06-07 DIAGNOSIS — M722 Plantar fascial fibromatosis: Secondary | ICD-10-CM | POA: Diagnosis not present

## 2015-06-21 DIAGNOSIS — Z0000 Encounter for general adult medical examination without abnormal findings: Secondary | ICD-10-CM | POA: Diagnosis not present

## 2015-07-04 DIAGNOSIS — L918 Other hypertrophic disorders of the skin: Secondary | ICD-10-CM | POA: Diagnosis not present

## 2015-07-11 DIAGNOSIS — M79672 Pain in left foot: Secondary | ICD-10-CM | POA: Diagnosis not present

## 2015-07-11 DIAGNOSIS — R5383 Other fatigue: Secondary | ICD-10-CM | POA: Diagnosis not present

## 2015-07-11 DIAGNOSIS — F320 Major depressive disorder, single episode, mild: Secondary | ICD-10-CM | POA: Diagnosis not present

## 2015-07-11 DIAGNOSIS — M722 Plantar fascial fibromatosis: Secondary | ICD-10-CM | POA: Diagnosis not present

## 2015-09-05 DIAGNOSIS — M722 Plantar fascial fibromatosis: Secondary | ICD-10-CM | POA: Diagnosis not present

## 2015-09-05 DIAGNOSIS — M79672 Pain in left foot: Secondary | ICD-10-CM | POA: Diagnosis not present

## 2015-09-11 DIAGNOSIS — R42 Dizziness and giddiness: Secondary | ICD-10-CM | POA: Diagnosis not present

## 2015-09-19 DIAGNOSIS — H40013 Open angle with borderline findings, low risk, bilateral: Secondary | ICD-10-CM | POA: Diagnosis not present

## 2015-09-23 DIAGNOSIS — R42 Dizziness and giddiness: Secondary | ICD-10-CM | POA: Diagnosis not present

## 2015-09-23 DIAGNOSIS — J0100 Acute maxillary sinusitis, unspecified: Secondary | ICD-10-CM | POA: Diagnosis not present

## 2015-10-25 DIAGNOSIS — Z23 Encounter for immunization: Secondary | ICD-10-CM | POA: Diagnosis not present

## 2015-12-13 DIAGNOSIS — L910 Hypertrophic scar: Secondary | ICD-10-CM | POA: Diagnosis not present

## 2016-02-05 DIAGNOSIS — L910 Hypertrophic scar: Secondary | ICD-10-CM | POA: Diagnosis not present

## 2016-02-05 DIAGNOSIS — L720 Epidermal cyst: Secondary | ICD-10-CM | POA: Diagnosis not present

## 2016-02-19 DIAGNOSIS — Z6820 Body mass index (BMI) 20.0-20.9, adult: Secondary | ICD-10-CM | POA: Diagnosis not present

## 2016-02-19 DIAGNOSIS — Z01419 Encounter for gynecological examination (general) (routine) without abnormal findings: Secondary | ICD-10-CM | POA: Diagnosis not present

## 2016-02-19 DIAGNOSIS — Z1231 Encounter for screening mammogram for malignant neoplasm of breast: Secondary | ICD-10-CM | POA: Diagnosis not present

## 2016-05-27 DIAGNOSIS — R590 Localized enlarged lymph nodes: Secondary | ICD-10-CM | POA: Diagnosis not present

## 2016-06-18 ENCOUNTER — Ambulatory Visit (INDEPENDENT_AMBULATORY_CARE_PROVIDER_SITE_OTHER): Payer: BLUE CROSS/BLUE SHIELD | Admitting: Sports Medicine

## 2016-06-18 ENCOUNTER — Encounter: Payer: Self-pay | Admitting: Sports Medicine

## 2016-06-18 DIAGNOSIS — M7742 Metatarsalgia, left foot: Secondary | ICD-10-CM

## 2016-06-19 NOTE — Progress Notes (Signed)
   Subjective:    Patient ID: [name], female    DOB: Sep 01, 1970, 46 y.o.   MRN: [id number]  HPI Mrs. [name] is a 46yo female presenting today for bilateral foot pain. Reports 3 week history of bilateral foot pain. Pain located at site of previous pain, over metatarsal heads. Pain started after transitioning to summer work shoes, which consist of wedges instead of the boots she was wearing in the winter. Reports pain is mild and that she is still able to run, however she then became worried it may be a stress fracture. Plans to start training for marathon on July 7 and wanted to make sure she would not damage her feet further. Has been rolling her feet over a rolling ball and frozen water bottle with some relief. Stopped wearing previous inserts made in 2016 approximately 1.5 years ago. Has lost custom orthotics that were made. Pain has been improving. Nonsmoker.  Review of Systems Per HPI    Objective:   Physical Exam  Constitutional: She appears well-developed and well-nourished. No distress.  Cardiovascular: Normal rate.   Pulmonary/Chest: Effort normal. No respiratory distress.  Musculoskeletal:  No effusoin noted. Ankle ROM symmetric. No tenderness over Achilles insertions, Achilles intact bilaterally. No tenderness over plantar fascia bilaterally. Tenderness located over metatarsal heads of left foot and over arches bilaterally. Flexible pes cavus noted bilaterally. Collapse of transverse arch bilaterally. Observation of gait while running reveals neutral form with forefoot strike.  Psychiatric: She has a normal mood and affect. Her behavior is normal.       Assessment & Plan:  1. Metatarsalgia of left foot Consistent with prior. Stress reaction or fracture unlikely. Improving. Metatarsal pad replaced on current inserts and recommend wearing inserts while running. Recommend Hoka shoes, but may use Neutrals if no improvement in pain. To look for custom orthotics and return if  she is unable to find them.

## 2016-06-29 DIAGNOSIS — Z0000 Encounter for general adult medical examination without abnormal findings: Secondary | ICD-10-CM | POA: Diagnosis not present

## 2016-06-30 DIAGNOSIS — D220 Melanocytic nevi of lip: Secondary | ICD-10-CM | POA: Diagnosis not present

## 2016-06-30 DIAGNOSIS — D225 Melanocytic nevi of trunk: Secondary | ICD-10-CM | POA: Diagnosis not present

## 2016-06-30 DIAGNOSIS — D2261 Melanocytic nevi of right upper limb, including shoulder: Secondary | ICD-10-CM | POA: Diagnosis not present

## 2016-06-30 DIAGNOSIS — L720 Epidermal cyst: Secondary | ICD-10-CM | POA: Diagnosis not present

## 2016-06-30 DIAGNOSIS — C44519 Basal cell carcinoma of skin of other part of trunk: Secondary | ICD-10-CM | POA: Diagnosis not present

## 2016-07-09 DIAGNOSIS — J3489 Other specified disorders of nose and nasal sinuses: Secondary | ICD-10-CM | POA: Diagnosis not present

## 2016-07-20 DIAGNOSIS — M6281 Muscle weakness (generalized): Secondary | ICD-10-CM | POA: Diagnosis not present

## 2016-07-20 DIAGNOSIS — M79604 Pain in right leg: Secondary | ICD-10-CM | POA: Diagnosis not present

## 2016-07-27 DIAGNOSIS — M79604 Pain in right leg: Secondary | ICD-10-CM | POA: Diagnosis not present

## 2016-07-27 DIAGNOSIS — M6281 Muscle weakness (generalized): Secondary | ICD-10-CM | POA: Diagnosis not present

## 2016-08-03 DIAGNOSIS — M79604 Pain in right leg: Secondary | ICD-10-CM | POA: Diagnosis not present

## 2016-08-03 DIAGNOSIS — M6281 Muscle weakness (generalized): Secondary | ICD-10-CM | POA: Diagnosis not present

## 2016-08-10 DIAGNOSIS — M6281 Muscle weakness (generalized): Secondary | ICD-10-CM | POA: Diagnosis not present

## 2016-08-10 DIAGNOSIS — M79604 Pain in right leg: Secondary | ICD-10-CM | POA: Diagnosis not present

## 2016-08-20 DIAGNOSIS — M79604 Pain in right leg: Secondary | ICD-10-CM | POA: Diagnosis not present

## 2016-08-20 DIAGNOSIS — M6281 Muscle weakness (generalized): Secondary | ICD-10-CM | POA: Diagnosis not present

## 2016-09-22 DIAGNOSIS — H40013 Open angle with borderline findings, low risk, bilateral: Secondary | ICD-10-CM | POA: Diagnosis not present

## 2016-09-22 DIAGNOSIS — H04123 Dry eye syndrome of bilateral lacrimal glands: Secondary | ICD-10-CM | POA: Diagnosis not present

## 2016-10-27 DIAGNOSIS — Z23 Encounter for immunization: Secondary | ICD-10-CM | POA: Diagnosis not present

## 2017-01-27 DIAGNOSIS — K529 Noninfective gastroenteritis and colitis, unspecified: Secondary | ICD-10-CM | POA: Diagnosis not present

## 2017-02-01 DIAGNOSIS — J40 Bronchitis, not specified as acute or chronic: Secondary | ICD-10-CM | POA: Diagnosis not present

## 2017-02-26 DIAGNOSIS — K589 Irritable bowel syndrome without diarrhea: Secondary | ICD-10-CM | POA: Diagnosis not present

## 2017-03-03 DIAGNOSIS — B002 Herpesviral gingivostomatitis and pharyngotonsillitis: Secondary | ICD-10-CM | POA: Insufficient documentation

## 2017-03-05 DIAGNOSIS — R000 Tachycardia, unspecified: Secondary | ICD-10-CM | POA: Insufficient documentation

## 2017-03-08 DIAGNOSIS — R0609 Other forms of dyspnea: Secondary | ICD-10-CM

## 2017-03-08 DIAGNOSIS — R011 Cardiac murmur, unspecified: Secondary | ICD-10-CM | POA: Insufficient documentation

## 2017-03-08 DIAGNOSIS — R0600 Dyspnea, unspecified: Secondary | ICD-10-CM | POA: Insufficient documentation

## 2017-03-16 DIAGNOSIS — K5900 Constipation, unspecified: Secondary | ICD-10-CM | POA: Diagnosis not present

## 2017-03-16 DIAGNOSIS — R197 Diarrhea, unspecified: Secondary | ICD-10-CM | POA: Diagnosis not present

## 2017-03-16 DIAGNOSIS — R140 Abdominal distension (gaseous): Secondary | ICD-10-CM | POA: Diagnosis not present

## 2017-03-16 DIAGNOSIS — R1030 Lower abdominal pain, unspecified: Secondary | ICD-10-CM | POA: Diagnosis not present

## 2017-03-16 DIAGNOSIS — K582 Mixed irritable bowel syndrome: Secondary | ICD-10-CM | POA: Diagnosis not present

## 2017-03-16 DIAGNOSIS — Z9049 Acquired absence of other specified parts of digestive tract: Secondary | ICD-10-CM | POA: Diagnosis not present

## 2017-03-17 DIAGNOSIS — Z803 Family history of malignant neoplasm of breast: Secondary | ICD-10-CM | POA: Diagnosis not present

## 2017-03-17 DIAGNOSIS — Z01419 Encounter for gynecological examination (general) (routine) without abnormal findings: Secondary | ICD-10-CM | POA: Diagnosis not present

## 2017-03-17 DIAGNOSIS — Z1231 Encounter for screening mammogram for malignant neoplasm of breast: Secondary | ICD-10-CM | POA: Diagnosis not present

## 2017-03-17 DIAGNOSIS — Z6822 Body mass index (BMI) 22.0-22.9, adult: Secondary | ICD-10-CM | POA: Diagnosis not present

## 2017-03-17 DIAGNOSIS — Z8041 Family history of malignant neoplasm of ovary: Secondary | ICD-10-CM | POA: Diagnosis not present

## 2017-03-30 DIAGNOSIS — R0609 Other forms of dyspnea: Secondary | ICD-10-CM | POA: Diagnosis not present

## 2017-03-30 DIAGNOSIS — R011 Cardiac murmur, unspecified: Secondary | ICD-10-CM | POA: Diagnosis not present

## 2017-03-31 DIAGNOSIS — T3695XA Adverse effect of unspecified systemic antibiotic, initial encounter: Secondary | ICD-10-CM

## 2017-03-31 DIAGNOSIS — B379 Candidiasis, unspecified: Secondary | ICD-10-CM | POA: Insufficient documentation

## 2017-04-28 DIAGNOSIS — Z809 Family history of malignant neoplasm, unspecified: Secondary | ICD-10-CM | POA: Diagnosis not present

## 2017-04-30 ENCOUNTER — Other Ambulatory Visit: Payer: Self-pay | Admitting: Obstetrics and Gynecology

## 2017-04-30 DIAGNOSIS — J4520 Mild intermittent asthma, uncomplicated: Secondary | ICD-10-CM | POA: Diagnosis not present

## 2017-04-30 DIAGNOSIS — Z803 Family history of malignant neoplasm of breast: Secondary | ICD-10-CM

## 2017-05-17 ENCOUNTER — Ambulatory Visit
Admission: RE | Admit: 2017-05-17 | Discharge: 2017-05-17 | Disposition: A | Discharge status: Home / Self Care | Payer: BLUE CROSS/BLUE SHIELD | Source: Ambulatory Visit | Attending: Sports Medicine | Admitting: Sports Medicine

## 2017-05-17 ENCOUNTER — Ambulatory Visit: Payer: BLUE CROSS/BLUE SHIELD | Admitting: Sports Medicine

## 2017-05-17 ENCOUNTER — Encounter: Payer: Self-pay | Admitting: Sports Medicine

## 2017-05-17 VITALS — BP 110/72 | Ht 63.0 in | Wt 125.0 lb

## 2017-05-17 DIAGNOSIS — M79674 Pain in right toe(s): Secondary | ICD-10-CM

## 2017-05-17 DIAGNOSIS — M79671 Pain in right foot: Secondary | ICD-10-CM | POA: Diagnosis not present

## 2017-05-17 NOTE — Patient Instructions (Signed)
You may apply topical Capsaicin cream or Aspercreme to the affected area. X-rays of your right foot are ordered at [facility].

## 2017-05-17 NOTE — Progress Notes (Signed)
     Date of Visit: 05/17/2017   HPI:  [name] is a 47 y.o. female who presents for pain in the great toe of the right foot. The pain has been present for about 1 year, but has worsened in the past 1-2 months. The pain is located at the IP joint and is worse with activities that dorsiflex the toe such as planks or squatting. The pain is sharp and 8/10 in severity. It will sometimes spread proximally to the forefoot. She has not tried anything to make it better. It is worse after long days at work when she wears dress shoes. Running does not aggravate the pain. She denies any history of injury to the area. She runs marathons and is planning to run a half marathon in two weeks. There have been no changes to her training regimen in the past few months. She denies any erythema or swelling in the toes. She denies numbness or tingling.   ROS: See HPI. As above  PHYSICAL EXAM: BP 110/72   Ht  (1.6 m)   Wt 125 lb (56.7 kg)   BMI 22.14 kg/m  Gen: well-appearing, NAD Heart: warm, well-perfused Lungs: non-labored breathing Right foot: No apparent deformities. Callus present at plantar surface of metatarsal heads. No swelling or erythema. TTP at dorsal and plantar surfaces of 1st MTPJ. Plantar surfaces of 2nd-4th metatarsal heads TTP. Full ROM with plantarflexion/dorsiflexion of toes. No pain with active or passive ROM of 1st MTPJ. Pain with passive ROM of 1st toe IPJ. Pain with resisted dorsiflexion of 1st IPJ. Arch is appropriate on standing. NVI distally.   ASSESSMENT/PLAN:  Rin [name] is a 47 y.o. female who presents with pain at the IPJ of the 1st toe. 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 The location would be abnormal for a stress fracture and she reports that running does not aggravate the pain. Plan to obtain AP/lateral/oblique plain films of the right foot to evaluate 1st IPJ. Will call patient with the results. Recommended  topical capsaicin or Aspercreme for pain. Will follow-up as needed following XR.    FOLLOW UP: Follow up as needed following right foot XR.   Dario Ave, Medical Student   Patient seen and evaluated with the medical student. I agree with the above plan of care.X-ray was reviewed. No significant degenerative changes are seen. Reassurance is given to the patient. I think she is okay to continue with activity as tolerated.If symptoms worsen we could consider a diagnostic/therapeutic cortisone injection into the IP joint or possibly a first ray post in her running shoes (please note that she has not found metatarsal pads or previous inserts to be helpful for other foot conditions).

## 2017-05-20 ENCOUNTER — Ambulatory Visit
Admission: RE | Admit: 2017-05-20 | Discharge: 2017-05-20 | Disposition: A | Discharge status: Home / Self Care | Payer: BLUE CROSS/BLUE SHIELD | Source: Ambulatory Visit | Attending: Obstetrics and Gynecology | Admitting: Obstetrics and Gynecology

## 2017-05-20 ENCOUNTER — Other Ambulatory Visit: Payer: Self-pay | Admitting: Obstetrics and Gynecology

## 2017-05-20 DIAGNOSIS — R928 Other abnormal and inconclusive findings on diagnostic imaging of breast: Secondary | ICD-10-CM

## 2017-05-20 DIAGNOSIS — Z803 Family history of malignant neoplasm of breast: Secondary | ICD-10-CM

## 2017-05-20 MED ORDER — GADOBENATE DIMEGLUMINE 529 MG/ML IV SOLN
10.0000 mL | Freq: Once | INTRAVENOUS | Status: AC | PRN
  Administered 2017-05-20: 10 mL via INTRAVENOUS

## 2017-05-26 ENCOUNTER — Other Ambulatory Visit: Payer: BLUE CROSS/BLUE SHIELD

## 2017-05-26 ENCOUNTER — Ambulatory Visit
Admission: RE | Admit: 2017-05-26 | Discharge: 2017-05-26 | Disposition: A | Discharge status: Home / Self Care | Payer: BLUE CROSS/BLUE SHIELD | Source: Ambulatory Visit | Attending: Obstetrics and Gynecology | Admitting: Obstetrics and Gynecology

## 2017-05-26 ENCOUNTER — Other Ambulatory Visit: Payer: Self-pay | Admitting: Obstetrics and Gynecology

## 2017-05-26 DIAGNOSIS — N6489 Other specified disorders of breast: Secondary | ICD-10-CM | POA: Diagnosis not present

## 2017-05-26 DIAGNOSIS — N6011 Diffuse cystic mastopathy of right breast: Secondary | ICD-10-CM | POA: Diagnosis not present

## 2017-05-26 DIAGNOSIS — R928 Other abnormal and inconclusive findings on diagnostic imaging of breast: Secondary | ICD-10-CM

## 2017-05-26 MED ORDER — GADOBENATE DIMEGLUMINE 529 MG/ML IV SOLN
10.0000 mL | Freq: Once | INTRAVENOUS | Status: AC | PRN
  Administered 2017-05-26: 10 mL via INTRAVENOUS

## 2017-06-10 DIAGNOSIS — M25551 Pain in right hip: Secondary | ICD-10-CM | POA: Diagnosis not present

## 2017-06-10 DIAGNOSIS — B001 Herpesviral vesicular dermatitis: Secondary | ICD-10-CM | POA: Diagnosis not present

## 2017-06-10 DIAGNOSIS — J3089 Other allergic rhinitis: Secondary | ICD-10-CM | POA: Diagnosis not present

## 2017-06-10 DIAGNOSIS — M6281 Muscle weakness (generalized): Secondary | ICD-10-CM | POA: Diagnosis not present

## 2017-06-15 DIAGNOSIS — M6281 Muscle weakness (generalized): Secondary | ICD-10-CM | POA: Diagnosis not present

## 2017-06-15 DIAGNOSIS — M25551 Pain in right hip: Secondary | ICD-10-CM | POA: Diagnosis not present

## 2017-06-17 DIAGNOSIS — M25551 Pain in right hip: Secondary | ICD-10-CM | POA: Diagnosis not present

## 2017-06-17 DIAGNOSIS — M6281 Muscle weakness (generalized): Secondary | ICD-10-CM | POA: Diagnosis not present

## 2017-06-23 DIAGNOSIS — Z0000 Encounter for general adult medical examination without abnormal findings: Secondary | ICD-10-CM | POA: Diagnosis not present

## 2017-06-23 DIAGNOSIS — M25551 Pain in right hip: Secondary | ICD-10-CM | POA: Diagnosis not present

## 2017-06-23 DIAGNOSIS — M6281 Muscle weakness (generalized): Secondary | ICD-10-CM | POA: Diagnosis not present

## 2017-07-01 DIAGNOSIS — M25551 Pain in right hip: Secondary | ICD-10-CM | POA: Diagnosis not present

## 2017-07-01 DIAGNOSIS — M6281 Muscle weakness (generalized): Secondary | ICD-10-CM | POA: Diagnosis not present

## 2017-07-07 DIAGNOSIS — M25551 Pain in right hip: Secondary | ICD-10-CM | POA: Diagnosis not present

## 2017-07-07 DIAGNOSIS — M6281 Muscle weakness (generalized): Secondary | ICD-10-CM | POA: Diagnosis not present

## 2017-08-02 DIAGNOSIS — Z6822 Body mass index (BMI) 22.0-22.9, adult: Secondary | ICD-10-CM | POA: Diagnosis not present

## 2017-08-02 DIAGNOSIS — F5101 Primary insomnia: Secondary | ICD-10-CM | POA: Insufficient documentation

## 2017-08-02 DIAGNOSIS — J45909 Unspecified asthma, uncomplicated: Secondary | ICD-10-CM | POA: Insufficient documentation

## 2017-08-02 DIAGNOSIS — N809 Endometriosis, unspecified: Secondary | ICD-10-CM | POA: Insufficient documentation

## 2017-08-02 DIAGNOSIS — Z0000 Encounter for general adult medical examination without abnormal findings: Secondary | ICD-10-CM | POA: Diagnosis not present

## 2017-08-02 DIAGNOSIS — J4520 Mild intermittent asthma, uncomplicated: Secondary | ICD-10-CM | POA: Diagnosis not present

## 2017-08-10 DIAGNOSIS — L910 Hypertrophic scar: Secondary | ICD-10-CM | POA: Diagnosis not present

## 2017-08-10 DIAGNOSIS — D220 Melanocytic nevi of lip: Secondary | ICD-10-CM | POA: Diagnosis not present

## 2017-08-10 DIAGNOSIS — D1801 Hemangioma of skin and subcutaneous tissue: Secondary | ICD-10-CM | POA: Diagnosis not present

## 2017-08-10 DIAGNOSIS — L821 Other seborrheic keratosis: Secondary | ICD-10-CM | POA: Diagnosis not present

## 2017-08-10 DIAGNOSIS — D225 Melanocytic nevi of trunk: Secondary | ICD-10-CM | POA: Diagnosis not present

## 2017-09-09 DIAGNOSIS — R7989 Other specified abnormal findings of blood chemistry: Secondary | ICD-10-CM | POA: Diagnosis not present

## 2017-09-14 DIAGNOSIS — M25561 Pain in right knee: Secondary | ICD-10-CM | POA: Diagnosis not present

## 2017-09-14 DIAGNOSIS — M79605 Pain in left leg: Secondary | ICD-10-CM | POA: Diagnosis not present

## 2017-09-14 DIAGNOSIS — M79604 Pain in right leg: Secondary | ICD-10-CM | POA: Diagnosis not present

## 2017-09-14 DIAGNOSIS — R262 Difficulty in walking, not elsewhere classified: Secondary | ICD-10-CM | POA: Diagnosis not present

## 2017-09-21 DIAGNOSIS — M79604 Pain in right leg: Secondary | ICD-10-CM | POA: Diagnosis not present

## 2017-09-21 DIAGNOSIS — M79605 Pain in left leg: Secondary | ICD-10-CM | POA: Diagnosis not present

## 2017-09-21 DIAGNOSIS — R262 Difficulty in walking, not elsewhere classified: Secondary | ICD-10-CM | POA: Diagnosis not present

## 2017-09-21 DIAGNOSIS — M25561 Pain in right knee: Secondary | ICD-10-CM | POA: Diagnosis not present

## 2017-09-30 DIAGNOSIS — H04123 Dry eye syndrome of bilateral lacrimal glands: Secondary | ICD-10-CM | POA: Diagnosis not present

## 2017-09-30 DIAGNOSIS — H40013 Open angle with borderline findings, low risk, bilateral: Secondary | ICD-10-CM | POA: Diagnosis not present

## 2017-10-11 DIAGNOSIS — Z23 Encounter for immunization: Secondary | ICD-10-CM | POA: Diagnosis not present

## 2017-10-13 DIAGNOSIS — R262 Difficulty in walking, not elsewhere classified: Secondary | ICD-10-CM | POA: Diagnosis not present

## 2017-10-13 DIAGNOSIS — M79604 Pain in right leg: Secondary | ICD-10-CM | POA: Diagnosis not present

## 2017-10-13 DIAGNOSIS — M25561 Pain in right knee: Secondary | ICD-10-CM | POA: Diagnosis not present

## 2017-10-13 DIAGNOSIS — M79605 Pain in left leg: Secondary | ICD-10-CM | POA: Diagnosis not present

## 2017-10-20 DIAGNOSIS — R262 Difficulty in walking, not elsewhere classified: Secondary | ICD-10-CM | POA: Diagnosis not present

## 2017-10-20 DIAGNOSIS — M79604 Pain in right leg: Secondary | ICD-10-CM | POA: Diagnosis not present

## 2017-10-20 DIAGNOSIS — M79605 Pain in left leg: Secondary | ICD-10-CM | POA: Diagnosis not present

## 2017-10-20 DIAGNOSIS — M25561 Pain in right knee: Secondary | ICD-10-CM | POA: Diagnosis not present

## 2017-10-29 DIAGNOSIS — M79605 Pain in left leg: Secondary | ICD-10-CM | POA: Diagnosis not present

## 2017-10-29 DIAGNOSIS — M25561 Pain in right knee: Secondary | ICD-10-CM | POA: Diagnosis not present

## 2017-10-29 DIAGNOSIS — R262 Difficulty in walking, not elsewhere classified: Secondary | ICD-10-CM | POA: Diagnosis not present

## 2017-10-29 DIAGNOSIS — M79604 Pain in right leg: Secondary | ICD-10-CM | POA: Diagnosis not present

## 2017-11-02 DIAGNOSIS — M79605 Pain in left leg: Secondary | ICD-10-CM | POA: Diagnosis not present

## 2017-11-02 DIAGNOSIS — R262 Difficulty in walking, not elsewhere classified: Secondary | ICD-10-CM | POA: Diagnosis not present

## 2017-11-02 DIAGNOSIS — M25561 Pain in right knee: Secondary | ICD-10-CM | POA: Diagnosis not present

## 2017-11-02 DIAGNOSIS — M79604 Pain in right leg: Secondary | ICD-10-CM | POA: Diagnosis not present

## 2017-11-23 ENCOUNTER — Other Ambulatory Visit: Payer: Self-pay | Admitting: Obstetrics and Gynecology

## 2017-11-23 DIAGNOSIS — Z803 Family history of malignant neoplasm of breast: Secondary | ICD-10-CM

## 2017-11-23 DIAGNOSIS — N6019 Diffuse cystic mastopathy of unspecified breast: Secondary | ICD-10-CM

## 2017-12-07 ENCOUNTER — Other Ambulatory Visit: Payer: BLUE CROSS/BLUE SHIELD

## 2017-12-15 DIAGNOSIS — R262 Difficulty in walking, not elsewhere classified: Secondary | ICD-10-CM | POA: Diagnosis not present

## 2017-12-15 DIAGNOSIS — M79605 Pain in left leg: Secondary | ICD-10-CM | POA: Diagnosis not present

## 2017-12-15 DIAGNOSIS — M79604 Pain in right leg: Secondary | ICD-10-CM | POA: Diagnosis not present

## 2017-12-15 DIAGNOSIS — M25561 Pain in right knee: Secondary | ICD-10-CM | POA: Diagnosis not present

## 2017-12-17 DIAGNOSIS — J0140 Acute pansinusitis, unspecified: Secondary | ICD-10-CM | POA: Diagnosis not present

## 2017-12-22 DIAGNOSIS — M25561 Pain in right knee: Secondary | ICD-10-CM | POA: Diagnosis not present

## 2017-12-22 DIAGNOSIS — R262 Difficulty in walking, not elsewhere classified: Secondary | ICD-10-CM | POA: Diagnosis not present

## 2017-12-22 DIAGNOSIS — M79605 Pain in left leg: Secondary | ICD-10-CM | POA: Diagnosis not present

## 2017-12-22 DIAGNOSIS — M79604 Pain in right leg: Secondary | ICD-10-CM | POA: Diagnosis not present

## 2017-12-31 ENCOUNTER — Ambulatory Visit
Admission: RE | Admit: 2017-12-31 | Discharge: 2017-12-31 | Disposition: A | Discharge status: Home / Self Care | Payer: BLUE CROSS/BLUE SHIELD | Source: Ambulatory Visit | Attending: Obstetrics and Gynecology | Admitting: Obstetrics and Gynecology

## 2017-12-31 DIAGNOSIS — N6019 Diffuse cystic mastopathy of unspecified breast: Secondary | ICD-10-CM

## 2017-12-31 DIAGNOSIS — R928 Other abnormal and inconclusive findings on diagnostic imaging of breast: Secondary | ICD-10-CM | POA: Diagnosis not present

## 2017-12-31 DIAGNOSIS — Z803 Family history of malignant neoplasm of breast: Secondary | ICD-10-CM

## 2017-12-31 MED ORDER — GADOBUTROL 1 MMOL/ML IV SOLN
6.0000 mL | Freq: Once | INTRAVENOUS | Status: AC | PRN
  Administered 2017-12-31: 6 mL via INTRAVENOUS

## 2018-01-07 ENCOUNTER — Other Ambulatory Visit: Payer: Self-pay | Admitting: Obstetrics and Gynecology

## 2018-01-07 DIAGNOSIS — R9389 Abnormal findings on diagnostic imaging of other specified body structures: Secondary | ICD-10-CM

## 2018-01-10 ENCOUNTER — Ambulatory Visit (INDEPENDENT_AMBULATORY_CARE_PROVIDER_SITE_OTHER): Payer: Self-pay | Admitting: Family Medicine

## 2018-01-11 ENCOUNTER — Ambulatory Visit (INDEPENDENT_AMBULATORY_CARE_PROVIDER_SITE_OTHER): Payer: BLUE CROSS/BLUE SHIELD | Admitting: Family Medicine

## 2018-01-11 ENCOUNTER — Encounter (INDEPENDENT_AMBULATORY_CARE_PROVIDER_SITE_OTHER): Payer: Self-pay | Admitting: Family Medicine

## 2018-01-11 ENCOUNTER — Ambulatory Visit (INDEPENDENT_AMBULATORY_CARE_PROVIDER_SITE_OTHER): Payer: Self-pay

## 2018-01-11 DIAGNOSIS — M79604 Pain in right leg: Secondary | ICD-10-CM | POA: Diagnosis not present

## 2018-01-11 NOTE — Progress Notes (Signed)
Office Visit Note   Patient: [name]           Date of Birth: [birth year]           MRN: [id number] Visit Date: 01/11/2018 Requested by: Ginnie Smart, MD [address] [place], [place] [address] PCP: Lindley Magnus, PA-C  Subjective: Chief Complaint  Patient presents with  . Right Lower Leg - Pain    Pain x 2 months - keeping her awake at night. PT not helping. Is a runner.    HPI: She is a 48 year old seen at the request of Dr. Chyrel Harris for right lower leg pain.  Symptoms started a few months ago.  She is a runner and ran her second marathon in November.  Prior to that she started noticing intermittent pain in her lower leg.  There was no major change in her running regimen account for this.  Now she is only doing a light training but her pain has persisted.  It hurts more when she is resting but it does with activity.  It aches and throbs when sitting or trying to sleep at night.  She has not noticed any numbness or weakness in her leg.  She has a history of lumbar microscopic discectomy several years ago for right-sided sciatica but that healed completely and she has not had any back pain or sciatica symptoms since then.  Her physical therapist noticed an indentation on the lateral aspect of her calf which makes it appear different than her left calf.  This is in the area of her pain.  She is not taking any medications for her pain.  She is never had a stress fracture.  Denies fevers, chills, night sweats, unintentional weight change.                ROS: She is otherwise in very good health.  She works at [employer].  Objective: Vital Signs: There were no vitals taken for this visit.  Physical Exam:  Back: Midline L5-S1 surgical scar well-healed.  No significant tenderness in the lumbar area but she does have some pain in the sciatic notch and near the right greater trochanter.  No pain with internal hip rotation.  5/5 hip flexion, knee extension, ankle eversion,  plantarflexion, and EHL testing but she has weakness with ankle dorsiflexion, 4/5 compared to 5/5 strength on the left.  2+ knee and ankle DTRs.  There is a slight indentation of the peroneal muscle proximal to mid muscle belly.  There is some tenderness along the proximal fibula and near the fibular head.  There appears to be some fatty tissue overlying the fibular head freely mobile.  Imaging: X-rays right tib/fib: No sign of stress fracture seen.  Musculoskeletal ultrasound: No sign of fibula stress fracture.  The common peroneal nerve has a normal echotexture.  No mass seen in the area to cause compression.   Assessment & Plan: 1.  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  Cannot rule out residual nerve injury from previous disc herniation. -Nerve conduction studies to evaluate.  If entrapment of nerve seen, she will resume physical therapy for treatment of this.  If she fails to improve, then surgical consult.   Follow-Up Instructions: No follow-ups on file.      Procedures: No procedures performed  No notes on file    PMFS History: Patient Active Problem List   Diagnosis Date Noted  . Endometriosis  08/02/2017  . Primary insomnia 08/02/2017  . Reactive airway disease without complication 08/02/2017  . Antibiotic-induced yeast infection 03/31/2017  . DOE (dyspnea on exertion) 03/08/2017  . Ejection murmur 03/08/2017  . Tachycardia 03/05/2017  . 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 Metatarsalgia of left foot 09/07/2014  . Pain in joint, ankle and foot 09/07/2014  . 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 Irritable bowel syndrome with constipation 11/07/2013  . Abdominal pain 09/02/2011   History reviewed. No pertinent past medical history.  History reviewed. No pertinent family history.  History reviewed. No pertinent surgical history. Social History   Occupational History  . Not on file  Tobacco  Use  . Smoking status: Never Smoker  . Smokeless tobacco: Never Used  Substance and Sexual Activity  . Alcohol use: Not on file  . Drug use: Not on file  . Sexual activity: Not on file

## 2018-01-26 ENCOUNTER — Ambulatory Visit (INDEPENDENT_AMBULATORY_CARE_PROVIDER_SITE_OTHER): Payer: BLUE CROSS/BLUE SHIELD | Admitting: Physical Medicine and Rehabilitation

## 2018-01-26 ENCOUNTER — Encounter (INDEPENDENT_AMBULATORY_CARE_PROVIDER_SITE_OTHER): Payer: Self-pay | Admitting: Physical Medicine and Rehabilitation

## 2018-01-26 DIAGNOSIS — M79604 Pain in right leg: Secondary | ICD-10-CM | POA: Diagnosis not present

## 2018-01-26 DIAGNOSIS — R531 Weakness: Secondary | ICD-10-CM

## 2018-01-26 NOTE — Progress Notes (Signed)
Numeric Pain Rating Scale and Functional Assessment Average Pain 5   In the last MONTH (on 0-10 scale) has pain interfered with the following?  1. General activity like being  able to carry out your everyday physical activities such as walking, climbing stairs, carrying groceries, or moving a chair?  Rating(4)    

## 2018-01-28 ENCOUNTER — Other Ambulatory Visit: Payer: BLUE CROSS/BLUE SHIELD

## 2018-01-28 ENCOUNTER — Inpatient Hospital Stay: Admission: RE | Admit: 2018-01-28 | Payer: BLUE CROSS/BLUE SHIELD | Source: Ambulatory Visit

## 2018-01-28 NOTE — Procedures (Signed)
EMG & NCV Findings: Evaluation of the right fibular motor nerve showed decreased conduction velocity (Poplt-B Fib, 35 m/s).  The right superficial fibular sensory nerve showed reduced amplitude (2.4 V).  All remaining nerves (as indicated in the following tables) were within normal limits.    All examined muscles (as indicated in the following table) showed no evidence of electrical instability.    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  From a technical standpoint, if the takeoff point of the motor action potential is moved slightly one way or the other it really is making a difference between this being a normal study and abnormal study.  This could be in the realm of technical artifact.  Careful clinical correlation is mandatory in this case.  Clinically 1 would expect peroneal or fibular nerve injury to include tingling numbness in that distribution which she does not have.  I wonder if there is some radicular component to this and this would not show up on the nerve study as a nerve study is just not that sensitive for mild radicular symptoms are radiculitis.  There is no significant electrodiagnostic evidence of any other focal nerve entrapment, lumbosacral plexopathy or lumbar radiculopathy.    As you know, this particular electrodiagnostic study cannot rule out chemical radiculitis or sensory only radiculopathy.  Recommendations: 1.  Follow-up with referring physician. 2.  Continue current management of symptoms. 3.  Suggest diagnostic injection with ultrasound, consider lumbar MRI if symptoms continue to worsen.  ___________________________ Alexa PlummerFred Brooklyne Radke FAAPMR Board Certified, American Board of Physical Medicine and Rehabilitation    Nerve Conduction Studies Anti Sensory Summary Table   Stim Site NR Peak (ms) Norm Peak (ms) P-T Amp (V) Norm  P-T Amp Site1 Site2 Delta-P (ms) Dist (cm) Vel (m/s) Norm Vel (m/s)  Right Saphenous Anti Sensory (Ant Med Mall)  28.6C  14cm    4.0 <4.4 11.3 >2 14cm Ant Med Mall 4.0 0.0  >32  Right Sup Fibular Anti Sensory (Ant Lat Mall)  28.5C  14 cm    3.5 <4.4 *2.4 >5.0 14 cm Ant Lat Mall 3.5 14.0 40 >32  Right Sural Anti Sensory (Lat Mall)  28.6C  Calf    3.5 <4.0 15.4 >5.0 Calf Lat Mall 3.5 14.0 40 >35   Motor Summary Table   Stim Site NR Onset (ms) Norm Onset (ms) O-P Amp (mV) Norm O-P Amp Site1 Site2 Delta-0 (ms) Dist (cm) Vel (m/s) Norm Vel (m/s)  Right Fibular Motor (Ext Dig Brev)  28.4C  Ankle    4.8 <6.1 4.2 >2.5 B Fib Ankle 6.0 30.0 50 >38  B Fib    10.8  3.7  Poplt B Fib 2.4 8.5 *35 >40  Poplt    13.2  2.9         Right Tibial Motor (Abd Hall Brev)  28.4C  Ankle    4.7 <6.1 11.0 >3.0 Knee Ankle 7.3 34.0 47 >35  Knee    12.0  7.5          EMG   Side Muscle Nerve Root Ins Act Fibs Psw Amp Dur Poly Recrt Int Dennie BiblePat Comment  Right AntTibialis Dp Br Peron L4-5 Nml Nml Nml Nml Nml 0 Nml Nml   Right Fibularis Longus  Sup Br Peron L5-S1 Nml Nml Nml Nml Nml 0 Nml Nml   Right MedGastroc Tibial S1-2 Nml Nml Nml Nml Nml  0 Nml Nml   Right VastusMed Femoral L2-4 Nml Nml Nml Nml Nml 0 Nml Nml   Right BicepsFemS Sciatic L5-S1 Nml Nml Nml Nml Nml 0 Nml Nml     Nerve Conduction Studies Anti Sensory Left/Right Comparison   Stim Site L Lat (ms) R Lat (ms) L-R Lat (ms) L Amp (V) R Amp (V) L-R Amp (%) Site1 Site2 L Vel (m/s) R Vel (m/s) L-R Vel (m/s)  Saphenous Anti Sensory (Ant Med Mall)  28.6C  14cm  4.0   11.3  14cm Ant Med Mall     Sup Fibular Anti Sensory (Ant Lat Mall)  28.5C  14 cm  3.5   *2.4  14 cm Ant Lat Mall  40   Sural Anti Sensory (Lat Mall)  28.6C  Calf  3.5   15.4  Calf Lat Mall  40    Motor Left/Right Comparison   Stim Site L Lat (ms) R Lat (ms) L-R Lat (ms) L Amp (mV) R Amp (mV) L-R Amp (%) Site1 Site2 L Vel (m/s) R Vel (m/s) L-R Vel (m/s)  Fibular Motor (Ext Dig Brev)   28.4C  Ankle  4.8   4.2  B Fib Ankle  50   B Fib  10.8   3.7  Poplt B Fib  *35   Poplt  13.2   2.9        Tibial Motor (Abd Hall Brev)  28.4C  Ankle  4.7   11.0  Knee Ankle  47   Knee  12.0   7.5           Waveforms:

## 2018-01-28 NOTE — Progress Notes (Signed)
[name] - 48 y.o. female MRN [id number]  Date of birth: 1970/05/07  Office Visit Note: Visit Date: 01/26/2018 PCP: Lindley Magnus, PA-C Referred by: Lindley Magnus, PA-C  Subjective: Chief Complaint  Patient presents with  . Right Lower Leg - Pain   HPI: [name] is a 48 y.o. female who comes in today At the request of Dr. Casimiro Needle hilts for left lower extremity electrodiagnostic study.  Patient is a runner and ran her second marathon in November and during the training for this and running for this she has had continued left lower extremity pain really laterally to the left knee.  She denies any right-sided complaints or specific trauma.  She reports this is progressively getting more severe.  She reports any activity makes it worse but the pain is constant it is worse with laying down at night it is a throbbing aching type pain.  She does report that wearing heels seems to make it worse and nothing so far is helped with the pain.  She has had x-ray imaging of the lower leg which was unremarkable.  She has a history of prior lumbar discectomy.  She has not had recent MRI of the lumbar spine.  She denies any paresthesias or numbness.  She denies any real focal weakness.  Dr. Prince Rome felt like she had some minor weakness with dorsiflexion on the left.  She has not had prior electrodiagnostic study.  She has not had MRI or bone scan from a perspective of stress reaction or fracture.  She has reduced her training but still runs may be on a 12 mile run on the weekend.  ROS Otherwise per HPI.  Assessment & Plan: Visit Diagnoses:  1. Pain in right leg   2. 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  From a technical standpoint, if the takeoff point of the motor action potential is moved slightly one way or the other it really is making a  difference between this being a normal study and abnormal study.  This could be in the realm of technical artifact.  Careful clinical correlation is mandatory in this case.  Clinically 1 would expect peroneal or fibular nerve injury to include tingling numbness in that distribution which she does not have.  I wonder if there is some radicular component to this and this would not show up on the nerve study as a nerve study is just not that sensitive for mild radicular symptoms are radiculitis.  There is no significant electrodiagnostic evidence of any other focal nerve entrapment, lumbosacral plexopathy or lumbar radiculopathy.    As you know, this particular electrodiagnostic study cannot rule out chemical radiculitis or sensory only radiculopathy.  Recommendations: 1.  Follow-up with referring physician. 2.  Continue current management of symptoms. 3.  Suggest diagnostic injection with ultrasound, consider lumbar MRI if symptoms continue to worsen.    Meds & Orders: No orders of the defined types were placed in this encounter.   Orders Placed This Encounter  Procedures  . NCV with EMG (electromyography)    Follow-up: Return for Lavada Mesi, MD.   Procedures: No procedures performed   EMG & NCV Findings: Evaluation of the right fibular motor nerve showed decreased conduction velocity (Poplt-B Fib, 35 m/s).  The right superficial fibular sensory nerve showed reduced amplitude (2.4 V).  All remaining  nerves (as indicated in the following tables) were within normal limits.    All examined muscles (as indicated in the following table) showed no evidence of electrical instability.    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  From a technical standpoint, if the takeoff point of the motor action potential is moved slightly one way or the other it really is  making a difference between this being a normal study and abnormal study.  This could be in the realm of technical artifact.  Careful clinical correlation is mandatory in this case.  Clinically 1 would expect peroneal or fibular nerve injury to include tingling numbness in that distribution which she does not have.  I wonder if there is some radicular component to this and this would not show up on the nerve study as a nerve study is just not that sensitive for mild radicular symptoms are radiculitis.  There is no significant electrodiagnostic evidence of any other focal nerve entrapment, lumbosacral plexopathy or lumbar radiculopathy.    As you know, this particular electrodiagnostic study cannot rule out chemical radiculitis or sensory only radiculopathy.  Recommendations: 1.  Follow-up with referring physician. 2.  Continue current management of symptoms. 3.  Suggest diagnostic injection with ultrasound, consider lumbar MRI if symptoms continue to worsen.  ___________________________ Naaman PlummerFred Yolanda Huffstetler FAAPMR Board Certified, American Board of Physical Medicine and Rehabilitation    Nerve Conduction Studies Anti Sensory Summary Table   Stim Site NR Peak (ms) Norm Peak (ms) P-T Amp (V) Norm P-T Amp Site1 Site2 Delta-P (ms) Dist (cm) Vel (m/s) Norm Vel (m/s)  Right Saphenous Anti Sensory (Ant Med Mall)  28.6C  14cm    4.0 <4.4 11.3 >2 14cm Ant Med Mall 4.0 0.0  >32  Right Sup Fibular Anti Sensory (Ant Lat Mall)  28.5C  14 cm    3.5 <4.4 *2.4 >5.0 14 cm Ant Lat Mall 3.5 14.0 40 >32  Right Sural Anti Sensory (Lat Mall)  28.6C  Calf    3.5 <4.0 15.4 >5.0 Calf Lat Mall 3.5 14.0 40 >35   Motor Summary Table   Stim Site NR Onset (ms) Norm Onset (ms) O-P Amp (mV) Norm O-P Amp Site1 Site2 Delta-0 (ms) Dist (cm) Vel (m/s) Norm Vel (m/s)  Right Fibular Motor (Ext Dig Brev)  28.4C  Ankle    4.8 <6.1 4.2 >2.5 B Fib Ankle 6.0 30.0 50 >38  B Fib    10.8  3.7  Poplt B Fib 2.4 8.5 *35 >40  Poplt     13.2  2.9         Right Tibial Motor (Abd Hall Brev)  28.4C  Ankle    4.7 <6.1 11.0 >3.0 Knee Ankle 7.3 34.0 47 >35  Knee    12.0  7.5          EMG   Side Muscle Nerve Root Ins Act Fibs Psw Amp Dur Poly Recrt Int Dennie BiblePat Comment  Right AntTibialis Dp Br Peron L4-5 Nml Nml Nml Nml Nml 0 Nml Nml   Right Fibularis Longus  Sup Br Peron L5-S1 Nml Nml Nml Nml Nml 0 Nml Nml   Right MedGastroc Tibial S1-2 Nml Nml Nml Nml Nml 0 Nml Nml   Right VastusMed Femoral L2-4 Nml Nml Nml Nml Nml 0 Nml Nml   Right BicepsFemS Sciatic L5-S1 Nml Nml Nml Nml Nml 0 Nml Nml  Nerve Conduction Studies Anti Sensory Left/Right Comparison   Stim Site L Lat (ms) R Lat (ms) L-R Lat (ms) L Amp (V) R Amp (V) L-R Amp (%) Site1 Site2 L Vel (m/s) R Vel (m/s) L-R Vel (m/s)  Saphenous Anti Sensory (Ant Med Mall)  28.6C  14cm  4.0   11.3  14cm Ant Med Mall     Sup Fibular Anti Sensory (Ant Lat Mall)  28.5C  14 cm  3.5   *2.4  14 cm Ant Lat Mall  40   Sural Anti Sensory (Lat Mall)  28.6C  Calf  3.5   15.4  Calf Lat Mall  40    Motor Left/Right Comparison   Stim Site L Lat (ms) R Lat (ms) L-R Lat (ms) L Amp (mV) R Amp (mV) L-R Amp (%) Site1 Site2 L Vel (m/s) R Vel (m/s) L-R Vel (m/s)  Fibular Motor (Ext Dig Brev)  28.4C  Ankle  4.8   4.2  B Fib Ankle  50   B Fib  10.8   3.7  Poplt B Fib  *35   Poplt  13.2   2.9        Tibial Motor (Abd Hall Brev)  28.4C  Ankle  4.7   11.0  Knee Ankle  47   Knee  12.0   7.5           Waveforms:            Clinical History: No specialty comments available.   She reports that she has never smoked. She has never used smokeless tobacco. No results for input(s): HGBA1C, LABURIC in the last 8760 hours.  Objective:  VS:  HT:    WT:   BMI:     BP:   HR: bpm  TEMP: ( )  RESP:  Physical Exam Musculoskeletal:     Right lower leg: No edema.     Left lower leg: No edema.     Comments: Patient has no intrinsic atrophy of the feet bilaterally.  There appears to be somewhat  of a flattening of the fibularis longus but is very mild.  There are no rashes or discoloration.  At least on my exam she appears to have good strength.  She has no decreased sensation to light touch.  Somewhat tender around the fibular head equivocal Tinel's.  Skin:    General: Skin is warm and dry.     Findings: No erythema or rash.  Neurological:     General: No focal deficit present.     Mental Status: She is oriented to person, place, and time.     Sensory: No sensory deficit.     Coordination: Coordination normal.     Gait: Gait normal.  Psychiatric:        Mood and Affect: Mood normal.        Behavior: Behavior normal.     Ortho Exam Imaging: No results found.  Past Medical/Family/Surgical/Social History: Medications & Allergies reviewed per EMR, new medications updated. Patient Active Problem List   Diagnosis Date Noted  . Endometriosis 08/02/2017  . Primary insomnia 08/02/2017  . Reactive airway disease without complication 08/02/2017  . Antibiotic-induced yeast infection 03/31/2017  . DOE (dyspnea on exertion) 03/08/2017  . Ejection murmur 03/08/2017  . Tachycardia 03/05/2017  . 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 Metatarsalgia of left foot 09/07/2014  . Pain in joint, ankle and foot 09/07/2014  . 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 Irritable bowel syndrome with constipation 11/07/2013  .  Abdominal pain 09/02/2011   History reviewed. No pertinent past medical history. History reviewed. No pertinent family history. History reviewed. No pertinent surgical history. Social History   Occupational History  . Not on file  Tobacco Use  . Smoking status: Never Smoker  . Smokeless tobacco: Never Used  Substance and Sexual Activity  . Alcohol use: Not on file  . Drug use: Not on file  . Sexual activity: Not on file

## 2018-01-31 ENCOUNTER — Encounter (INDEPENDENT_AMBULATORY_CARE_PROVIDER_SITE_OTHER): Payer: Self-pay | Admitting: Family Medicine

## 2018-01-31 ENCOUNTER — Ambulatory Visit (INDEPENDENT_AMBULATORY_CARE_PROVIDER_SITE_OTHER): Payer: BLUE CROSS/BLUE SHIELD | Admitting: Family Medicine

## 2018-01-31 DIAGNOSIS — M79604 Pain in right leg: Secondary | ICD-10-CM

## 2018-01-31 DIAGNOSIS — G5731 Lesion of lateral popliteal nerve, right lower limb: Secondary | ICD-10-CM | POA: Diagnosis not present

## 2018-01-31 NOTE — Progress Notes (Signed)
   Office Visit Note   Patient: [name]           Date of Birth: 02/09/1970           MRN: [id number] Visit Date: 01/31/2018 Requested by: Lindley MagnusLong, Ashley, PA-C [address], [place] [id number] PCP: Lindley MagnusLong, Ashley, PA-C  Subjective: Chief Complaint  Patient presents with  . Right Lower Leg - Follow-up    S/p NCS with Dr. Alvester MorinNewton.    HPI: She is here to discuss nerve study results.  No change in her symptoms.  We discussed possible causes of her problem.  She recently had gait analysis done again at [employer] and she was told she needs motion control shoes, the same as she was told several years ago by [employer].  When she runs on the street, she generally runs on the left side of the road facing traffic.  She tries to run on flat surfaces whenever traffic is clear.  Incidentally, today she mentioned that she is having some intermittent tailbone area pain.  Is not bothering her much today.  She had it a couple months ago and her PCP gave her an intramuscular steroid injection since she also had a sinus infection.  The pain went away until just recently.               ROS: Otherwise noncontributory  Objective: Vital Signs: There were no vitals taken for this visit.  Physical Exam:  Legs: Leg lengths are equal with measurement of 81 cm from ASIS to the medial malleolus.  Bilateral high arches with normal motion.  Normal strength with ankle eversion against resistance.  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   Imaging: None today.  Assessment & Plan: 1.  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  Anticipate 6 to 12 weeks nerve recovery time.  If still having symptoms then, we will consider consultation with Dr. August Saucerean to discuss possible surgical options.  Could also contemplate cortisone injection, but would like to avoid that if possible.   Follow-Up  Instructions: No follow-ups on file.      Procedures: No procedures performed  No notes on file    PMFS History: Patient Active Problem List   Diagnosis Date Noted  . Endometriosis 08/02/2017  . Primary insomnia 08/02/2017  . Reactive airway disease without complication 08/02/2017  . Antibiotic-induced yeast infection 03/31/2017  . DOE (dyspnea on exertion) 03/08/2017  . Ejection murmur 03/08/2017  . Tachycardia 03/05/2017  . 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 Metatarsalgia of left foot 09/07/2014  . Pain in joint, ankle and foot 09/07/2014  . 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 Irritable bowel syndrome with constipation 11/07/2013  . Abdominal pain 09/02/2011   History reviewed. No pertinent past medical history.  History reviewed. No pertinent family history.  History reviewed. No pertinent surgical history. Social History   Occupational History  . Not on file  Tobacco Use  . Smoking status: Never Smoker  . Smokeless tobacco: Never Used  Substance and Sexual Activity  . Alcohol use: Not on file  . Drug use: Not on file  . Sexual activity: Not on file

## 2018-02-01 DIAGNOSIS — M79604 Pain in right leg: Secondary | ICD-10-CM | POA: Diagnosis not present

## 2018-02-01 DIAGNOSIS — M79605 Pain in left leg: Secondary | ICD-10-CM | POA: Diagnosis not present

## 2018-02-01 DIAGNOSIS — M6281 Muscle weakness (generalized): Secondary | ICD-10-CM | POA: Diagnosis not present

## 2018-02-01 DIAGNOSIS — R262 Difficulty in walking, not elsewhere classified: Secondary | ICD-10-CM | POA: Diagnosis not present

## 2018-02-03 DIAGNOSIS — M79604 Pain in right leg: Secondary | ICD-10-CM | POA: Diagnosis not present

## 2018-02-03 DIAGNOSIS — R262 Difficulty in walking, not elsewhere classified: Secondary | ICD-10-CM | POA: Diagnosis not present

## 2018-02-03 DIAGNOSIS — M6281 Muscle weakness (generalized): Secondary | ICD-10-CM | POA: Diagnosis not present

## 2018-02-03 DIAGNOSIS — M79605 Pain in left leg: Secondary | ICD-10-CM | POA: Diagnosis not present

## 2018-02-08 DIAGNOSIS — R262 Difficulty in walking, not elsewhere classified: Secondary | ICD-10-CM | POA: Diagnosis not present

## 2018-02-08 DIAGNOSIS — M6281 Muscle weakness (generalized): Secondary | ICD-10-CM | POA: Diagnosis not present

## 2018-02-08 DIAGNOSIS — M79604 Pain in right leg: Secondary | ICD-10-CM | POA: Diagnosis not present

## 2018-02-08 DIAGNOSIS — M79605 Pain in left leg: Secondary | ICD-10-CM | POA: Diagnosis not present

## 2018-02-10 DIAGNOSIS — M79604 Pain in right leg: Secondary | ICD-10-CM | POA: Diagnosis not present

## 2018-02-10 DIAGNOSIS — M6281 Muscle weakness (generalized): Secondary | ICD-10-CM | POA: Diagnosis not present

## 2018-02-10 DIAGNOSIS — R262 Difficulty in walking, not elsewhere classified: Secondary | ICD-10-CM | POA: Diagnosis not present

## 2018-02-15 DIAGNOSIS — M79604 Pain in right leg: Secondary | ICD-10-CM | POA: Diagnosis not present

## 2018-02-15 DIAGNOSIS — M6281 Muscle weakness (generalized): Secondary | ICD-10-CM | POA: Diagnosis not present

## 2018-02-15 DIAGNOSIS — R262 Difficulty in walking, not elsewhere classified: Secondary | ICD-10-CM | POA: Diagnosis not present

## 2018-02-17 DIAGNOSIS — R262 Difficulty in walking, not elsewhere classified: Secondary | ICD-10-CM | POA: Diagnosis not present

## 2018-02-17 DIAGNOSIS — M79604 Pain in right leg: Secondary | ICD-10-CM | POA: Diagnosis not present

## 2018-02-17 DIAGNOSIS — M6281 Muscle weakness (generalized): Secondary | ICD-10-CM | POA: Diagnosis not present

## 2018-02-18 ENCOUNTER — Ambulatory Visit (INDEPENDENT_AMBULATORY_CARE_PROVIDER_SITE_OTHER): Payer: BLUE CROSS/BLUE SHIELD | Admitting: Family Medicine

## 2018-02-24 DIAGNOSIS — M6281 Muscle weakness (generalized): Secondary | ICD-10-CM | POA: Diagnosis not present

## 2018-02-24 DIAGNOSIS — R262 Difficulty in walking, not elsewhere classified: Secondary | ICD-10-CM | POA: Diagnosis not present

## 2018-02-24 DIAGNOSIS — M79604 Pain in right leg: Secondary | ICD-10-CM | POA: Diagnosis not present

## 2018-02-28 ENCOUNTER — Ambulatory Visit
Admission: RE | Admit: 2018-02-28 | Discharge: 2018-02-28 | Disposition: A | Discharge status: Home / Self Care | Payer: BLUE CROSS/BLUE SHIELD | Source: Ambulatory Visit | Attending: Obstetrics and Gynecology | Admitting: Obstetrics and Gynecology

## 2018-02-28 ENCOUNTER — Other Ambulatory Visit: Payer: Self-pay | Admitting: Obstetrics and Gynecology

## 2018-02-28 ENCOUNTER — Ambulatory Visit: Payer: BLUE CROSS/BLUE SHIELD

## 2018-02-28 DIAGNOSIS — N6489 Other specified disorders of breast: Secondary | ICD-10-CM | POA: Diagnosis not present

## 2018-02-28 DIAGNOSIS — R9389 Abnormal findings on diagnostic imaging of other specified body structures: Secondary | ICD-10-CM

## 2018-02-28 MED ORDER — GADOBUTROL 1 MMOL/ML IV SOLN
6.0000 mL | Freq: Once | INTRAVENOUS | Status: AC | PRN
  Administered 2018-02-28: 6 mL via INTRAVENOUS

## 2018-03-02 DIAGNOSIS — M6281 Muscle weakness (generalized): Secondary | ICD-10-CM | POA: Diagnosis not present

## 2018-03-02 DIAGNOSIS — M79604 Pain in right leg: Secondary | ICD-10-CM | POA: Diagnosis not present

## 2018-03-02 DIAGNOSIS — R262 Difficulty in walking, not elsewhere classified: Secondary | ICD-10-CM | POA: Diagnosis not present

## 2018-03-08 DIAGNOSIS — M79604 Pain in right leg: Secondary | ICD-10-CM | POA: Diagnosis not present

## 2018-03-08 DIAGNOSIS — M6281 Muscle weakness (generalized): Secondary | ICD-10-CM | POA: Diagnosis not present

## 2018-03-08 DIAGNOSIS — R262 Difficulty in walking, not elsewhere classified: Secondary | ICD-10-CM | POA: Diagnosis not present

## 2018-03-10 DIAGNOSIS — R5382 Chronic fatigue, unspecified: Secondary | ICD-10-CM | POA: Diagnosis not present

## 2018-03-10 DIAGNOSIS — M722 Plantar fascial fibromatosis: Secondary | ICD-10-CM | POA: Diagnosis not present

## 2018-03-10 DIAGNOSIS — R000 Tachycardia, unspecified: Secondary | ICD-10-CM | POA: Diagnosis not present

## 2018-03-10 DIAGNOSIS — R0609 Other forms of dyspnea: Secondary | ICD-10-CM | POA: Diagnosis not present

## 2018-03-14 DIAGNOSIS — H6590 Unspecified nonsuppurative otitis media, unspecified ear: Secondary | ICD-10-CM | POA: Diagnosis not present

## 2018-03-14 DIAGNOSIS — R5383 Other fatigue: Secondary | ICD-10-CM | POA: Diagnosis not present

## 2018-03-14 DIAGNOSIS — K2970 Gastritis, unspecified, without bleeding: Secondary | ICD-10-CM | POA: Diagnosis not present

## 2018-05-26 DIAGNOSIS — M79604 Pain in right leg: Secondary | ICD-10-CM | POA: Diagnosis not present

## 2018-05-26 DIAGNOSIS — R262 Difficulty in walking, not elsewhere classified: Secondary | ICD-10-CM | POA: Diagnosis not present

## 2018-05-26 DIAGNOSIS — M25572 Pain in left ankle and joints of left foot: Secondary | ICD-10-CM | POA: Diagnosis not present

## 2018-05-26 DIAGNOSIS — M6281 Muscle weakness (generalized): Secondary | ICD-10-CM | POA: Diagnosis not present

## 2018-05-31 DIAGNOSIS — M25572 Pain in left ankle and joints of left foot: Secondary | ICD-10-CM | POA: Diagnosis not present

## 2018-05-31 DIAGNOSIS — R262 Difficulty in walking, not elsewhere classified: Secondary | ICD-10-CM | POA: Diagnosis not present

## 2018-05-31 DIAGNOSIS — M6281 Muscle weakness (generalized): Secondary | ICD-10-CM | POA: Diagnosis not present

## 2018-05-31 DIAGNOSIS — M79604 Pain in right leg: Secondary | ICD-10-CM | POA: Diagnosis not present

## 2018-06-02 DIAGNOSIS — Z1231 Encounter for screening mammogram for malignant neoplasm of breast: Secondary | ICD-10-CM | POA: Diagnosis not present

## 2018-06-02 DIAGNOSIS — R5383 Other fatigue: Secondary | ICD-10-CM | POA: Diagnosis not present

## 2018-06-02 DIAGNOSIS — Z01419 Encounter for gynecological examination (general) (routine) without abnormal findings: Secondary | ICD-10-CM | POA: Diagnosis not present

## 2018-06-02 DIAGNOSIS — Z1329 Encounter for screening for other suspected endocrine disorder: Secondary | ICD-10-CM | POA: Diagnosis not present

## 2018-06-02 DIAGNOSIS — Z6823 Body mass index (BMI) 23.0-23.9, adult: Secondary | ICD-10-CM | POA: Diagnosis not present

## 2018-06-02 DIAGNOSIS — M1990 Unspecified osteoarthritis, unspecified site: Secondary | ICD-10-CM | POA: Diagnosis not present

## 2018-06-02 DIAGNOSIS — Z13228 Encounter for screening for other metabolic disorders: Secondary | ICD-10-CM | POA: Diagnosis not present

## 2018-06-06 DIAGNOSIS — M6281 Muscle weakness (generalized): Secondary | ICD-10-CM | POA: Diagnosis not present

## 2018-06-06 DIAGNOSIS — M79604 Pain in right leg: Secondary | ICD-10-CM | POA: Diagnosis not present

## 2018-06-06 DIAGNOSIS — M25572 Pain in left ankle and joints of left foot: Secondary | ICD-10-CM | POA: Diagnosis not present

## 2018-06-06 DIAGNOSIS — R262 Difficulty in walking, not elsewhere classified: Secondary | ICD-10-CM | POA: Diagnosis not present

## 2018-06-08 DIAGNOSIS — R5383 Other fatigue: Secondary | ICD-10-CM | POA: Diagnosis not present

## 2018-06-08 DIAGNOSIS — R61 Generalized hyperhidrosis: Secondary | ICD-10-CM | POA: Diagnosis not present

## 2018-06-08 DIAGNOSIS — E162 Hypoglycemia, unspecified: Secondary | ICD-10-CM | POA: Diagnosis not present

## 2018-06-08 DIAGNOSIS — Z6822 Body mass index (BMI) 22.0-22.9, adult: Secondary | ICD-10-CM | POA: Diagnosis not present

## 2018-06-13 DIAGNOSIS — M25572 Pain in left ankle and joints of left foot: Secondary | ICD-10-CM | POA: Diagnosis not present

## 2018-06-13 DIAGNOSIS — R262 Difficulty in walking, not elsewhere classified: Secondary | ICD-10-CM | POA: Diagnosis not present

## 2018-06-13 DIAGNOSIS — M6281 Muscle weakness (generalized): Secondary | ICD-10-CM | POA: Diagnosis not present

## 2018-06-13 DIAGNOSIS — M79604 Pain in right leg: Secondary | ICD-10-CM | POA: Diagnosis not present

## 2018-06-14 DIAGNOSIS — E162 Hypoglycemia, unspecified: Secondary | ICD-10-CM | POA: Diagnosis not present

## 2018-06-14 DIAGNOSIS — R61 Generalized hyperhidrosis: Secondary | ICD-10-CM | POA: Diagnosis not present

## 2018-06-14 DIAGNOSIS — R5383 Other fatigue: Secondary | ICD-10-CM | POA: Diagnosis not present

## 2018-06-20 DIAGNOSIS — M6281 Muscle weakness (generalized): Secondary | ICD-10-CM | POA: Diagnosis not present

## 2018-06-20 DIAGNOSIS — M79604 Pain in right leg: Secondary | ICD-10-CM | POA: Diagnosis not present

## 2018-06-20 DIAGNOSIS — R262 Difficulty in walking, not elsewhere classified: Secondary | ICD-10-CM | POA: Diagnosis not present

## 2018-06-20 DIAGNOSIS — M25572 Pain in left ankle and joints of left foot: Secondary | ICD-10-CM | POA: Diagnosis not present

## 2018-07-06 DIAGNOSIS — E162 Hypoglycemia, unspecified: Secondary | ICD-10-CM | POA: Diagnosis not present

## 2018-07-06 DIAGNOSIS — R5383 Other fatigue: Secondary | ICD-10-CM | POA: Diagnosis not present

## 2018-07-06 DIAGNOSIS — Z6822 Body mass index (BMI) 22.0-22.9, adult: Secondary | ICD-10-CM | POA: Diagnosis not present

## 2018-07-06 DIAGNOSIS — R61 Generalized hyperhidrosis: Secondary | ICD-10-CM | POA: Diagnosis not present

## 2018-07-26 DIAGNOSIS — Z760 Encounter for issue of repeat prescription: Secondary | ICD-10-CM | POA: Diagnosis not present

## 2018-07-26 DIAGNOSIS — B001 Herpesviral vesicular dermatitis: Secondary | ICD-10-CM | POA: Diagnosis not present

## 2018-07-26 DIAGNOSIS — A6000 Herpesviral infection of urogenital system, unspecified: Secondary | ICD-10-CM | POA: Diagnosis not present

## 2018-10-13 DIAGNOSIS — H5202 Hypermetropia, left eye: Secondary | ICD-10-CM | POA: Diagnosis not present

## 2018-10-13 DIAGNOSIS — H524 Presbyopia: Secondary | ICD-10-CM | POA: Diagnosis not present

## 2018-10-13 DIAGNOSIS — H5211 Myopia, right eye: Secondary | ICD-10-CM | POA: Diagnosis not present

## 2018-10-13 DIAGNOSIS — H52203 Unspecified astigmatism, bilateral: Secondary | ICD-10-CM | POA: Diagnosis not present

## 2018-10-13 DIAGNOSIS — H40013 Open angle with borderline findings, low risk, bilateral: Secondary | ICD-10-CM | POA: Diagnosis not present

## 2018-10-13 DIAGNOSIS — H04123 Dry eye syndrome of bilateral lacrimal glands: Secondary | ICD-10-CM | POA: Diagnosis not present

## 2018-10-13 IMAGING — MR MR BREAST BX W LOC DEV 1ST LESION IMAGE BX SPEC MR GUIDE*R*
6 of 8 series · 33 of 48 positions shown · IV contrast (10 ML MULTIHANCE)
Comparison: Previous exams.

ADDENDUM:
Pathology revealed FIBROCYSTIC CHANGES WITH USUAL DUCTAL HYPERPLASIA
AND CALCIFICATIONS of the Right breast, lower outer quadrant. This
was found to be concordant by Dr. Kuroiwa Maruno.

Pathology results were discussed with the patient by telephone. The
patient reported doing well after the biopsy with tenderness at the
site. Post biopsy instructions and care were reviewed and questions
were answered. The patient was encouraged to call The [REDACTED]
The patient was instructed to return for a bilateral breast MRI in 6
months and informed a reminder notice would be sent regarding this
appointment.
Pathology results reported by Janiah Gonzalez Gonzalez, RN on 05/27/2017.
CLINICAL DATA: Clumped enhancement seen in the lower outer quadrant
of the right breast on recent screening MRI. Patient has an
estimated lifetime risk of 24%. Family history of breast cancer in
her paternal grandmother at the age of 60.
EXAM:
MRI GUIDED CORE NEEDLE BIOPSY OF THE RIGHT BREAST
TECHNIQUE: Multiplanar, multisequence MR imaging of the right breast was
performed both before and after administration of intravenous
contrast.
CONTRAST:  10mL MULTIHANCE GADOBENATE DIMEGLUMINE 529 MG/ML IV SOLN

[Series 2: fiducial unilateral · sagittal · 2.0mm · 1.33mm/px · 1 of 52 slices shown]
[im 1/52]
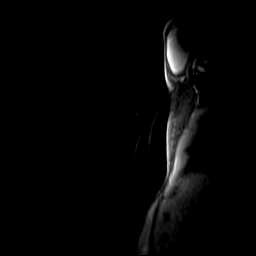

[Series 3: dynamic pre · axial · non-contrast · 1.3mm · 0.73mm/px · z∈[-88,+98]mm · 6 of 144 slices shown]
[im 1/144]
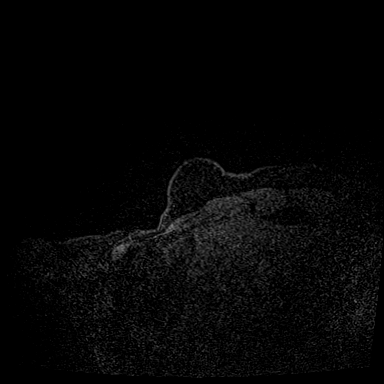
[im 29/144]
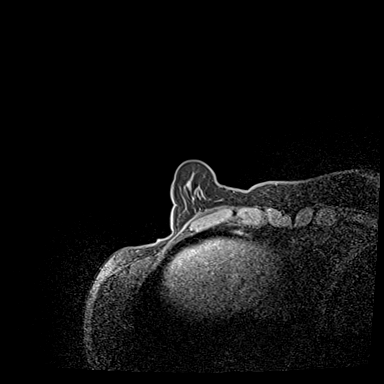
[im 58/144]
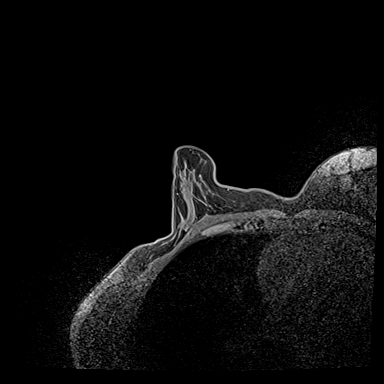
[im 86/144]
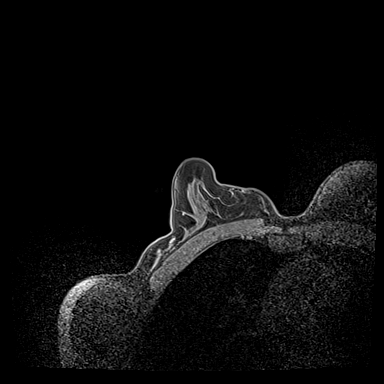
[im 115/144]
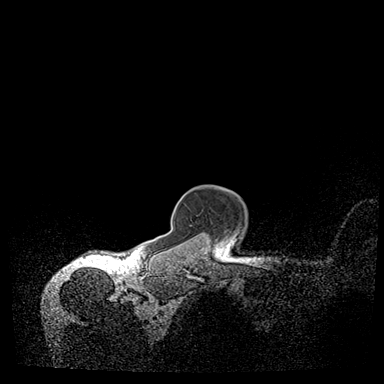
[im 144/144]
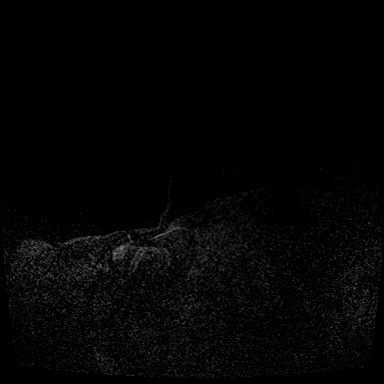

[Series 4: dynamic post 20 · axial · 1.3mm · 0.73mm/px · z∈[-88,+98]mm · 6 of 144 slices shown (1 of 2)]
[im 1/144]
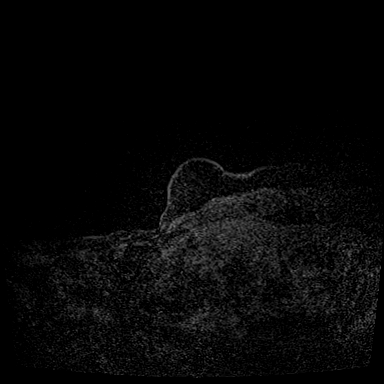
[im 29/144]
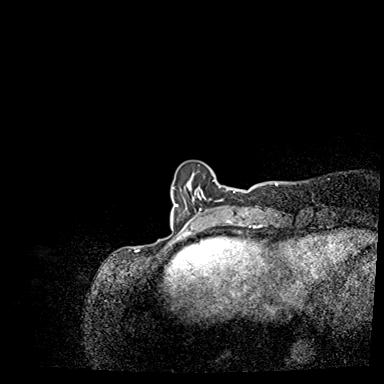
[im 58/144]
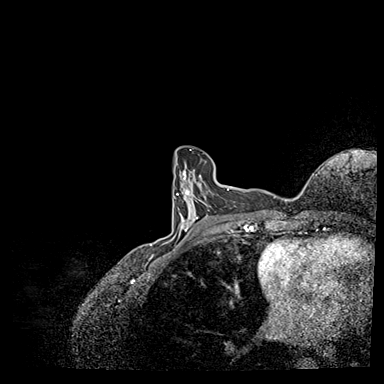
[im 86/144]
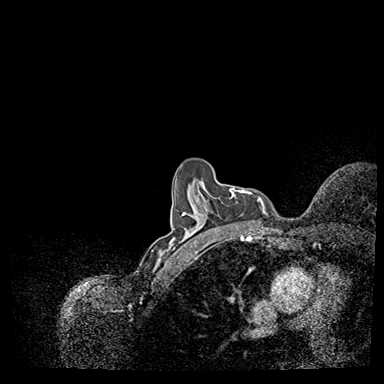
[im 115/144]
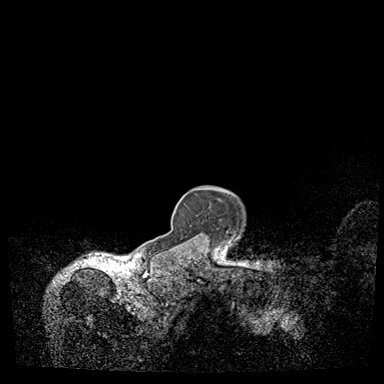
[im 144/144]
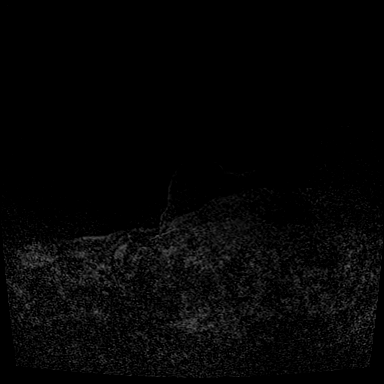

[Series 5: dynamic post 20 · axial · 1.3mm · 0.73mm/px · z∈[-88,+98]mm · 7 of 144 slices shown (2 of 2)]
[im 1/144]
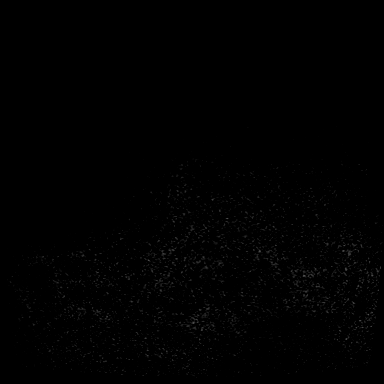
[im 24/144]
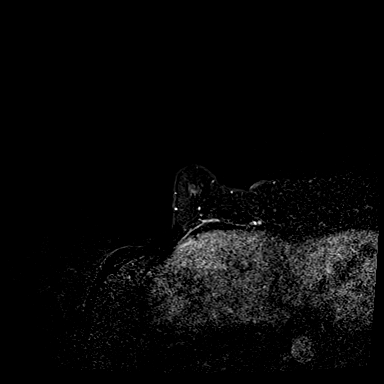
[im 48/144]
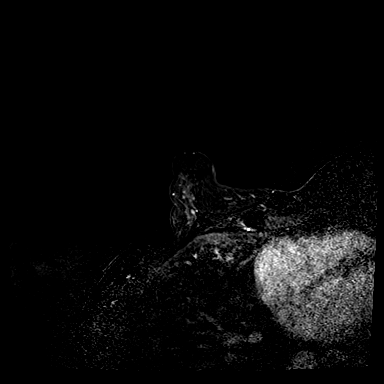
[im 72/144]
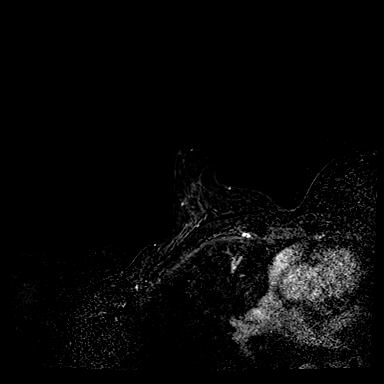
[im 96/144]
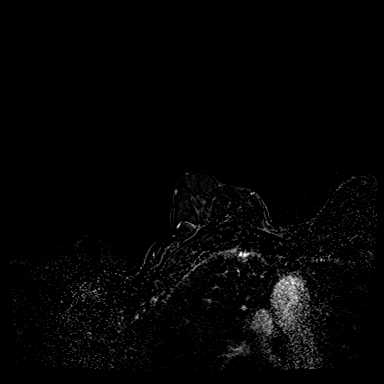
[im 120/144]
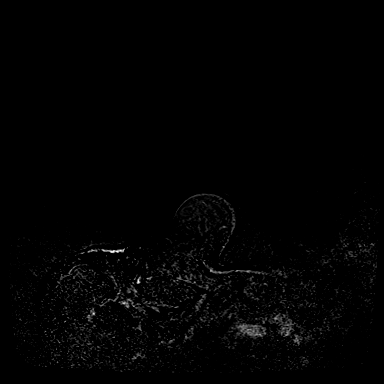
[im 144/144]
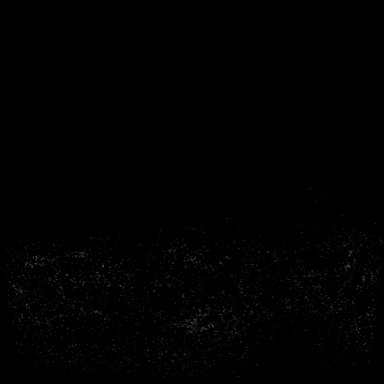

[Series 6: needle confirmation · axial · 1.3mm · 0.73mm/px · z∈[-88,+98]mm · 7 of 144 slices shown]
[im 1/144]
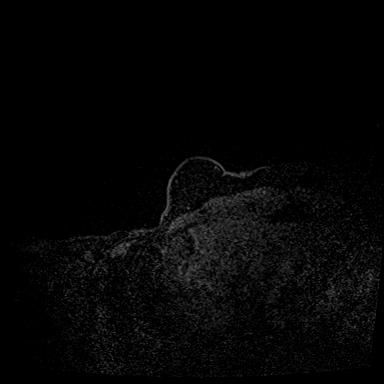
[im 24/144]
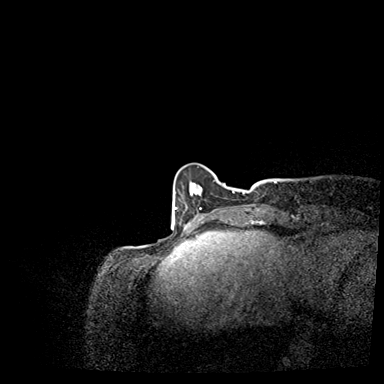
[im 48/144]
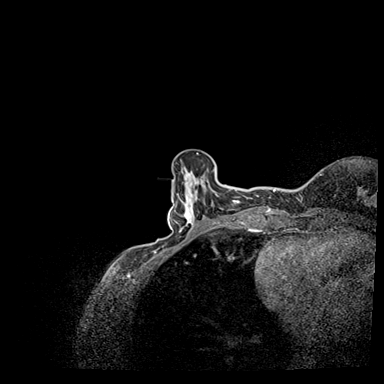
[im 72/144]
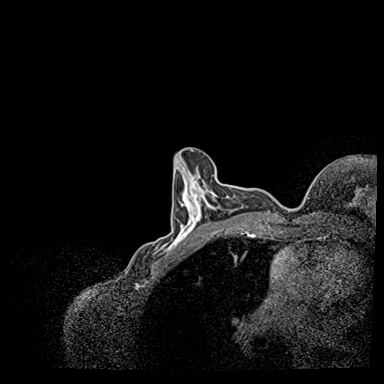
[im 96/144]
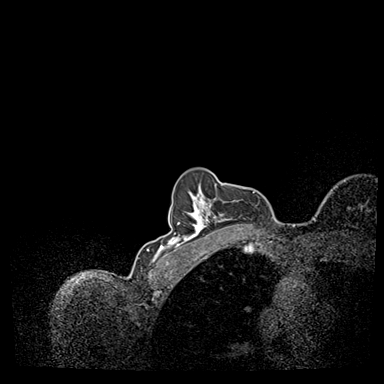
[im 120/144]
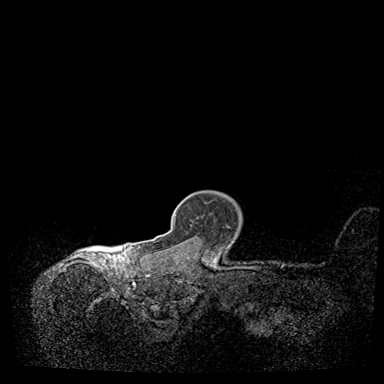
[im 144/144]
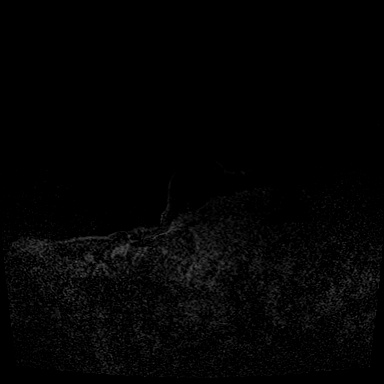

[Series 7: needle confirmation_sub · axial · 1.3mm · 0.73mm/px · z∈[-88,+67]mm · 6 of 144 slices shown]
[im 1/144]
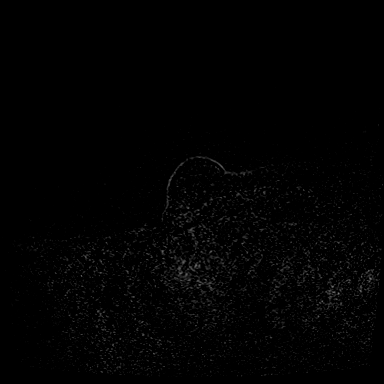
[im 24/144]
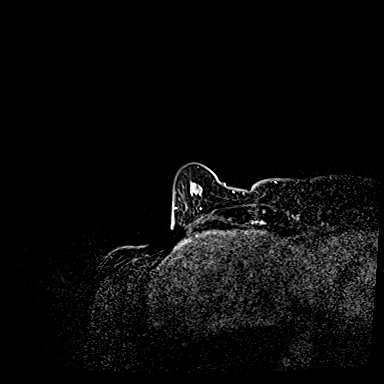
[im 48/144]
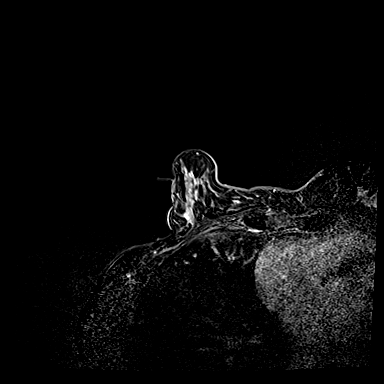
[im 72/144]
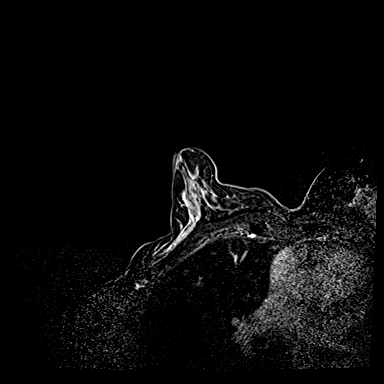
[im 96/144]
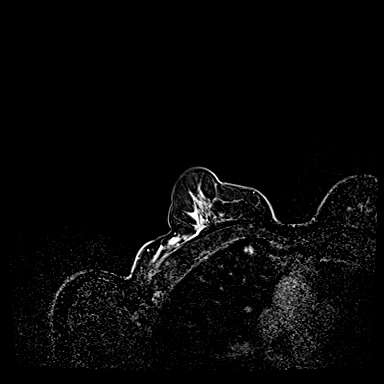
[im 120/144]
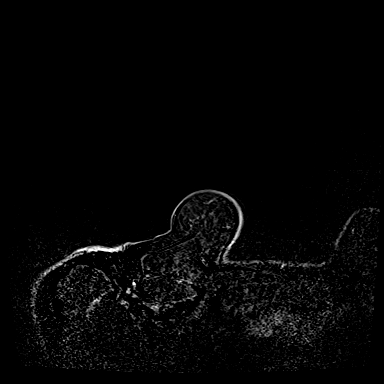

[33 of 48 positions shown; findings below may reference images not displayed]

FINDINGS: I met with the patient, and we discussed the procedure of MRI guided
biopsy, including risks, benefits, and alternatives. Specifically,
we discussed the risks of infection, bleeding, tissue injury, clip
migration, and inadequate sampling. Informed, written consent was
given. The usual time out protocol was performed immediately prior
to the procedure.

Using sterile technique, 1% Lidocaine, MRI guidance, and a 9 gauge
vacuum assisted device, biopsy was performed of clumped enhancement
in the lower outer quadrant of the right breast using a lateral to
medial approach. At the conclusion of the procedure, a
dumbbell-shaped tissue marker clip was deployed into the biopsy
cavity. Follow-up 2-view mammogram was performed and dictated
separately.
IMPRESSION: MRI guided biopsy of the right breast.  No apparent complications.

## 2018-10-21 DIAGNOSIS — L910 Hypertrophic scar: Secondary | ICD-10-CM | POA: Diagnosis not present

## 2018-10-21 DIAGNOSIS — D2272 Melanocytic nevi of left lower limb, including hip: Secondary | ICD-10-CM | POA: Diagnosis not present

## 2018-10-21 DIAGNOSIS — L821 Other seborrheic keratosis: Secondary | ICD-10-CM | POA: Diagnosis not present

## 2018-10-21 DIAGNOSIS — D225 Melanocytic nevi of trunk: Secondary | ICD-10-CM | POA: Diagnosis not present

## 2018-10-21 DIAGNOSIS — L570 Actinic keratosis: Secondary | ICD-10-CM | POA: Diagnosis not present

## 2018-10-28 DIAGNOSIS — M79604 Pain in right leg: Secondary | ICD-10-CM | POA: Diagnosis not present

## 2018-10-28 DIAGNOSIS — R262 Difficulty in walking, not elsewhere classified: Secondary | ICD-10-CM | POA: Diagnosis not present

## 2018-10-28 DIAGNOSIS — M25572 Pain in left ankle and joints of left foot: Secondary | ICD-10-CM | POA: Diagnosis not present

## 2018-10-28 DIAGNOSIS — M6281 Muscle weakness (generalized): Secondary | ICD-10-CM | POA: Diagnosis not present

## 2018-11-17 DIAGNOSIS — Z03818 Encounter for observation for suspected exposure to other biological agents ruled out: Secondary | ICD-10-CM | POA: Diagnosis not present

## 2018-11-18 DIAGNOSIS — Z20828 Contact with and (suspected) exposure to other viral communicable diseases: Secondary | ICD-10-CM | POA: Diagnosis not present

## 2018-11-20 DIAGNOSIS — Z20828 Contact with and (suspected) exposure to other viral communicable diseases: Secondary | ICD-10-CM | POA: Diagnosis not present

## 2018-12-05 DIAGNOSIS — M6281 Muscle weakness (generalized): Secondary | ICD-10-CM | POA: Diagnosis not present

## 2018-12-05 DIAGNOSIS — M25561 Pain in right knee: Secondary | ICD-10-CM | POA: Diagnosis not present

## 2018-12-05 DIAGNOSIS — M25562 Pain in left knee: Secondary | ICD-10-CM | POA: Diagnosis not present

## 2018-12-05 DIAGNOSIS — R262 Difficulty in walking, not elsewhere classified: Secondary | ICD-10-CM | POA: Diagnosis not present

## 2018-12-13 DIAGNOSIS — R262 Difficulty in walking, not elsewhere classified: Secondary | ICD-10-CM | POA: Diagnosis not present

## 2018-12-13 DIAGNOSIS — M25561 Pain in right knee: Secondary | ICD-10-CM | POA: Diagnosis not present

## 2018-12-13 DIAGNOSIS — M25562 Pain in left knee: Secondary | ICD-10-CM | POA: Diagnosis not present

## 2018-12-13 DIAGNOSIS — M6281 Muscle weakness (generalized): Secondary | ICD-10-CM | POA: Diagnosis not present

## 2019-03-02 ENCOUNTER — Encounter: Payer: Self-pay | Admitting: Sports Medicine

## 2019-03-02 ENCOUNTER — Ambulatory Visit (INDEPENDENT_AMBULATORY_CARE_PROVIDER_SITE_OTHER): Payer: BC Managed Care – PPO | Admitting: Sports Medicine

## 2019-03-02 ENCOUNTER — Other Ambulatory Visit: Payer: Self-pay

## 2019-03-02 VITALS — BP 108/66 | Ht 63.0 in | Wt 116.0 lb

## 2019-03-02 DIAGNOSIS — M25579 Pain in unspecified ankle and joints of unspecified foot: Secondary | ICD-10-CM | POA: Diagnosis not present

## 2019-03-02 NOTE — Progress Notes (Signed)
Subjective:    Patient ID: [name], female    DOB: 1970/07/03, 49 y.o.   MRN: [id number]  HPI chief complaint: Bilateral foot pain  Very pleasant 49 year old runner comes in today complaining of several weeks of bilateral foot pain.  Pain in the left foot is primarily along the plantar aspect of the foot near the metatarsal heads of each of her toes.  She denies any specific injury.  She has not noticed any swelling.  She denies any change in volume of running.  She has tried several different pairs of shoes.  She had a similar problem in 2016 and was fitted with green sports insoles with metatarsal pads but found the metatarsal pads to be uncomfortable.  We eventually put additional cushioning along the forefoot in the form of a poron metatarsal bar.  She only recently began to use those inserts again.  She is also complaining of pain in the right foot and ankle.  Pain in the right ankle is in the anterior ankle and most noticeable with active dorsiflexion and plantar flexion.  She has not noticed any swelling.  She denies any recent trauma.  She is also getting pain along the dorsum of the right foot that she localizes to the midportion of the second third metatarsals.  This pain has only been present for a day or so.  She is a very Science writer runner having run several half marathons and marathons.  Intermedic history reviewed Medications reviewed Allergies reviewed    Review of Systems As above    Objective:   Physical Exam  Well-developed, well-nourished, fit appearing.  No acute distress.  Awake alert and oriented x3.  Vital signs reviewed.  Left foot: Minimal tenderness to palpation along the plantar aspect of the forefoot.  No focal tenderness appreciated.  No soft tissue swelling.  There is no tenderness to palpation across the dorsum of the foot.  Negative metatarsal squeeze.  Good pulses.  Right foot and ankle: Right ankle demonstrates full active and passive range of  motion.  No soft tissue swelling.  No effusion.  There is no tenderness to palpation along the extensor tendons.  No pain with resisted dorsiflexion of the foot.  Mild ankle laxity with talar tilt and anterior drawer testing. There is tenderness to palpation along the dorsum of the foot, specifically near the midportion of the second and third metatarsals.  No soft tissue swelling is seen here.  Good pulses.  Evaluation of her running form shows good form.  She has a midfoot striker.  She runs without a limp.  Brief bedside MSK of the right foot shows no structural abnormality of the right ankle.  Talar dome is well visualized without abnormality.  No obvious spurring.  There is a small area of soft tissue swelling along the lateral ankle near the ATF.  Ultrasound of the midportion of the third metatarsal shows a fluid cap with neovascularity but no cortical fracture.      Assessment & Plan:   Left foot pain secondary to metatarsalgia Right foot pain secondary to very early stress reaction Right ankle pain likely secondary to mild instability  Patient will resume her green sports insoles.  The Poron metatarsal bar is still intact on the left.  Green inserts were fitted with a small metatarsal pad on the left and an extra small on the right.  Since she previously had problems with her metatarsal pads I have given her permission to remove these if they  are uncomfortable.  I think she is getting a very early stress reaction in the third metatarsal in her right foot so I have asked her to decrease her running by about 50% and slowly increase based on her metatarsal pain.  Her right ankle pain is likely secondary to some mild underlying instability and we will give her some ankle strengthening exercises.  She also has a body helix compression sleeve which I encouraged her to wear when exercising.  Patient will follow up with me again in 4 weeks for reevaluation.  I did discuss the possibility of custom  orthotics and she will check with her insurance company first.  She will call with questions or concerns prior to her follow-up visit.

## 2019-03-21 ENCOUNTER — Ambulatory Visit
Admission: RE | Admit: 2019-03-21 | Discharge: 2019-03-21 | Disposition: A | Discharge status: Home / Self Care | Payer: BC Managed Care – PPO | Source: Ambulatory Visit | Attending: Sports Medicine | Admitting: Sports Medicine

## 2019-03-21 ENCOUNTER — Ambulatory Visit: Payer: BC Managed Care – PPO | Admitting: Sports Medicine

## 2019-03-21 ENCOUNTER — Other Ambulatory Visit: Payer: Self-pay

## 2019-03-21 VITALS — BP 120/80 | Ht 63.0 in | Wt 118.0 lb

## 2019-03-21 DIAGNOSIS — M79672 Pain in left foot: Secondary | ICD-10-CM

## 2019-03-21 DIAGNOSIS — M7732 Calcaneal spur, left foot: Secondary | ICD-10-CM | POA: Diagnosis not present

## 2019-03-21 DIAGNOSIS — M25552 Pain in left hip: Secondary | ICD-10-CM | POA: Diagnosis not present

## 2019-03-21 MED ORDER — MELOXICAM 15 MG PO TABS: ORAL_TABLET | ORAL | 0 refills | Status: DC

## 2019-03-22 NOTE — Progress Notes (Addendum)
Subjective:    Patient ID: [name], female    DOB: [birth year], 49 y.o.   MRN: [id number]  HPI   Patient comes in today for follow-up on bilateral foot pain.  Dorsal right foot pain is improving.  She has developed some new pain along the lateral aspect of her right foot over the past week or so.  No swelling.  She has been able to continue running at about 50% of her normal volume without much difficulty.  She found the compression sleeve uncomfortable so she has been running without it.  She also continues to endorse pain in the left foot on the plantar aspect near the second metatarsal head.  The metatarsal pads in her shoes are not providing her much symptom relief.  She is denying dorsal foot pain on the left.  She is also complaining of new left-sided hip pain.  This has been present for about a week.  She does not recall any specific injury but her pain did begin after a bike ride.  She localizes it to the left groin with some radiating pain down into the proximal femur.  Most noticeable with activities such as deep squatting or lunges.  No numbness or tingling.    Review of Systems As above    Objective:   Physical Exam  Well-developed, well-nourished.  No acute distress.  Awake alert and oriented x3.  Vital signs reviewed.  Right foot: Full ankle range of motion.  There is no effusion.  There is still slight tenderness to palpation along the midportion of the second and third metatarsals but no significant pain with metatarsal squeeze.  No tenderness to palpation along the plantar aspect of the foot.  There is some slight tenderness diffusely along the lateral column but no soft tissue swelling.  Good strength.  Mild laxity once again appreciated with talar tilt and anterior drawer.  Left foot: Patient continues to have tenderness to palpation directly over the plantar aspect of the foot near the MTP joint.  No tenderness to palpation across the dorsum of the foot.  Negative  metatarsal squeeze.  Neurovascular intact distally.  Left hip: Smooth painless hip range of motion with a negative logroll.  Negative FADIR, negative Pearlean Brownie.  There is reproducible pain with resisted hip flexion as well as with iliopsoas stretching.  Patient walks without a noticeable limp.      Assessment & Plan:   Improving right foot pain likely secondary to metatarsal stress reaction New onset lateral right foot pain, likely compensatory to #1 Left foot metatarsalgia Left hip pain secondary to iliopsoas strain  I would like to place the patient on meloxicam 15 mg a day for the next 7 days.  She may then take it as needed afterwards.  I think she is okay to continue exercising using pain as her guide but limiting it to about 50% of her normal volume.  I would like to get an x-ray of the left foot to rule out Freiberg's disease.  Phone follow-up with those results when available.  For her left hip pain, we will give her some iliopsoas stretches.  Patient will follow up with me again in a couple of weeks.  We will plan on custom orthotics at that visit.  I think it will help her metatarsalgia on the left foot.  Call with questions or concerns in the interim.  Addendum: X-rays reviewed.  They are unremarkable other than a small calcaneal spur.  No evidence of Freiberg's  disease.

## 2019-03-30 ENCOUNTER — Ambulatory Visit: Payer: BC Managed Care – PPO | Admitting: Sports Medicine

## 2019-04-11 ENCOUNTER — Other Ambulatory Visit: Payer: Self-pay

## 2019-04-11 ENCOUNTER — Ambulatory Visit: Payer: BC Managed Care – PPO | Admitting: Sports Medicine

## 2019-04-11 VITALS — BP 124/66 | Ht 63.0 in | Wt 120.0 lb

## 2019-04-11 DIAGNOSIS — M79672 Pain in left foot: Secondary | ICD-10-CM | POA: Diagnosis not present

## 2019-04-12 ENCOUNTER — Encounter: Payer: Self-pay | Admitting: Sports Medicine

## 2019-04-12 NOTE — Progress Notes (Signed)
Patient ID: [name], female   DOB: 03-Jan-1971, 49 y.o.   MRN: [id number]  Patient comes in today for custom orthotics.  She is still endorsing some pain at the second MTP joint of the left foot.  However, despite this, she has been able to increase her mileage.  She has had a couple of runs of 10 miles.  In fact, she notes that she had one of her best runs in a long time a few days ago.  Her left hip pain is still present.  It is not terribly painful with running but she does notice discomfort with certain activities such as reverse crunches.  She does not think the metatarsal pad on her left great insert is making much of a difference.  I discussed proceeding with custom orthotics with the patient today.  While I cannot guarantee that they will resolve all of her issues I do think it is worth trying.  Custom orthotic will give her more cushioning at the forefoot which will hopefully in turn alleviate her left foot pain.  If not, we could consider a cortisone injection in the future.  For her ongoing left hip pain, I recommended that she continue with her home exercises for now.  If pain in this area worsens, I would consider merits of formal physical therapy along with an x-ray.  She will follow-up with me for ongoing or recalcitrant issues.  Patient was fitted for a : standard, cushioned, semi-rigid orthotic. The orthotic was heated and afterward the patient stood on the orthotic blank positioned on the orthotic stand. The patient was positioned in subtalar neutral position and 10 degrees of ankle dorsiflexion in a weight bearing stance. After completion of molding, a stable base was applied to the orthotic blank. The blank was ground to a stable position for weight bearing. Size: 7 Base: Blue EVA Posting: none Additional orthotic padding: none  Patient found the orthotics to be comfortable prior to leaving the office.  Gait was neutral with orthotics in place.

## 2019-04-14 DIAGNOSIS — Z01812 Encounter for preprocedural laboratory examination: Secondary | ICD-10-CM | POA: Diagnosis not present

## 2019-04-14 DIAGNOSIS — Z20822 Contact with and (suspected) exposure to covid-19: Secondary | ICD-10-CM | POA: Diagnosis not present

## 2019-04-20 DIAGNOSIS — K9089 Other intestinal malabsorption: Secondary | ICD-10-CM | POA: Diagnosis not present

## 2019-04-20 DIAGNOSIS — R140 Abdominal distension (gaseous): Secondary | ICD-10-CM | POA: Diagnosis not present

## 2019-04-20 DIAGNOSIS — A048 Other specified bacterial intestinal infections: Secondary | ICD-10-CM | POA: Diagnosis not present

## 2019-05-04 ENCOUNTER — Other Ambulatory Visit: Payer: Self-pay

## 2019-05-04 ENCOUNTER — Ambulatory Visit: Payer: BC Managed Care – PPO | Admitting: Sports Medicine

## 2019-05-04 ENCOUNTER — Encounter: Payer: Self-pay | Admitting: Sports Medicine

## 2019-05-04 ENCOUNTER — Ambulatory Visit
Admission: RE | Admit: 2019-05-04 | Discharge: 2019-05-04 | Disposition: A | Discharge status: Home / Self Care | Payer: BC Managed Care – PPO | Source: Ambulatory Visit | Attending: Sports Medicine | Admitting: Sports Medicine

## 2019-05-04 VITALS — BP 110/60 | Ht 63.0 in | Wt 118.0 lb

## 2019-05-04 DIAGNOSIS — M25552 Pain in left hip: Secondary | ICD-10-CM

## 2019-05-04 DIAGNOSIS — M25551 Pain in right hip: Secondary | ICD-10-CM | POA: Diagnosis not present

## 2019-05-04 DIAGNOSIS — R1030 Lower abdominal pain, unspecified: Secondary | ICD-10-CM | POA: Diagnosis not present

## 2019-05-04 NOTE — Progress Notes (Signed)
   Subjective:    Patient ID: [name], female    DOB: Oct 16, 1970, 49 y.o.   MRN: [id number]  HPI   Patient comes in today with persistent left hip pain.  She is actually beginning to get right hip pain as well. She localizes her pain to the groin.  She is an avid runner and has pain with running but most of her pain occurs with hip flexion exercises when lying supine.  Pain will radiate at times down the medial aspect of the proximal femur.  She is beginning to have pain at night as well.  She has been doing iliopsoas stretches and exercises for several weeks now.  Despite that, her pain is beginning to worsen.    Review of Systems    As above Objective:   Physical Exam  Well-developed, well-nourished.  No acute distress.  Awake alert and oriented x3.  Vital signs reviewed  Examination of the left hip shows smooth, full range of motion.  She does have reproducible pain with internal rotation.  No tenderness to palpation.  Slight pain with resisted hip flexion.  No tenderness over the greater trochanteric bursa.  Neurovascularly intact distally.  Examination of the right hip shows full range of motion.  Mild reproducible pain in the right groin with internal rotation.  No pain with hip flexion.  No tenderness to palpation.  Neurovascular intact distally.  AP of the pelvis is unremarkable.  Specifically no degenerative changes are seen and no obvious femoral neck stress fracture      Assessment & Plan:   Bilateral hip pain, left greater than right, worrisome for early femoral neck stress fractures  Patient has persistent pain despite home rehabilitation exercises.  X-rays are unremarkable but I've explained to the patient that early femoral neck stress fractures would not show up on plain x-ray.  Therefore, I would like to order an MRI of the pelvis specifically to rule out bilateral femoral neck stress fractures.  MRI of the pelvis is the preferred imaging for B/L femoral neck  stress fractures, not MRI of the hips.  Phone follow-up with those results when available.  We will delineate further treatment based on those findings.

## 2019-05-10 DIAGNOSIS — M5137 Other intervertebral disc degeneration, lumbosacral region: Secondary | ICD-10-CM | POA: Diagnosis not present

## 2019-05-10 DIAGNOSIS — M2480 Other specific joint derangements of unspecified joint, not elsewhere classified: Secondary | ICD-10-CM | POA: Diagnosis not present

## 2019-05-10 DIAGNOSIS — M47816 Spondylosis without myelopathy or radiculopathy, lumbar region: Secondary | ICD-10-CM | POA: Diagnosis not present

## 2019-05-10 DIAGNOSIS — M25451 Effusion, right hip: Secondary | ICD-10-CM | POA: Diagnosis not present

## 2019-05-16 ENCOUNTER — Encounter: Payer: Self-pay | Admitting: Sports Medicine

## 2019-05-16 ENCOUNTER — Telehealth: Payer: Self-pay | Admitting: Sports Medicine

## 2019-05-16 NOTE — Telephone Encounter (Signed)
  I spoke with [name] on the phone yesterday after reviewing the MRI of her pelvis done on May 5.  No evidence of femoral neck stress fracture.  She does have small to moderate nonspecific joint effusions which are likely degenerative in nature.  She has some mild cartilage thinning and subchondral cysts in the anterior superior left acetabulum.  Incidental finding of advanced degenerative disc disease at L5-S1 and mild insertional tendinosis of the gluteus medius tendon.  Based on these findings I recommended a short course of meloxicam.  She will take 15 mg daily for the next several days.  If this is ineffective then I will change to a different anti-inflammatory.  I think she can continue with activity using pain as her guide but I did recommend that she modify her workouts until her hip is feeling better.  Follow-up for ongoing or recalcitrant issues.

## 2019-05-25 ENCOUNTER — Telehealth: Payer: Self-pay

## 2019-05-25 MED ORDER — DICLOFENAC SODIUM 75 MG PO TBEC: 75.0000 mg | DELAYED_RELEASE_TABLET | Freq: Two times a day (BID) | ORAL | 0 refills | Status: DC

## 2019-05-25 NOTE — Telephone Encounter (Signed)
Per Dr. Margaretha Sheffield- will stop Meloxicam and try Diclofenac 75 mg twice daily. She will call us Monday with an update. Pt understands and agrees with the plan.

## 2019-05-30 ENCOUNTER — Other Ambulatory Visit: Payer: BC Managed Care – PPO

## 2019-06-01 DIAGNOSIS — R140 Abdominal distension (gaseous): Secondary | ICD-10-CM | POA: Diagnosis not present

## 2019-06-01 DIAGNOSIS — R12 Heartburn: Secondary | ICD-10-CM | POA: Diagnosis not present

## 2019-06-01 DIAGNOSIS — K639 Disease of intestine, unspecified: Secondary | ICD-10-CM | POA: Diagnosis not present

## 2019-06-01 DIAGNOSIS — R1030 Lower abdominal pain, unspecified: Secondary | ICD-10-CM | POA: Diagnosis not present

## 2019-06-01 DIAGNOSIS — K2950 Unspecified chronic gastritis without bleeding: Secondary | ICD-10-CM | POA: Diagnosis not present

## 2019-06-01 DIAGNOSIS — R1013 Epigastric pain: Secondary | ICD-10-CM | POA: Diagnosis not present

## 2019-06-01 DIAGNOSIS — K6389 Other specified diseases of intestine: Secondary | ICD-10-CM | POA: Diagnosis not present

## 2019-06-01 DIAGNOSIS — K648 Other hemorrhoids: Secondary | ICD-10-CM | POA: Diagnosis not present

## 2019-06-01 DIAGNOSIS — R109 Unspecified abdominal pain: Secondary | ICD-10-CM | POA: Diagnosis not present

## 2019-06-01 DIAGNOSIS — R1310 Dysphagia, unspecified: Secondary | ICD-10-CM | POA: Diagnosis not present

## 2019-06-22 DIAGNOSIS — Z1231 Encounter for screening mammogram for malignant neoplasm of breast: Secondary | ICD-10-CM | POA: Diagnosis not present

## 2019-06-22 DIAGNOSIS — Z01419 Encounter for gynecological examination (general) (routine) without abnormal findings: Secondary | ICD-10-CM | POA: Diagnosis not present

## 2019-06-22 DIAGNOSIS — Z309 Encounter for contraceptive management, unspecified: Secondary | ICD-10-CM | POA: Diagnosis not present

## 2019-06-22 DIAGNOSIS — Z6821 Body mass index (BMI) 21.0-21.9, adult: Secondary | ICD-10-CM | POA: Diagnosis not present

## 2019-07-05 DIAGNOSIS — R140 Abdominal distension (gaseous): Secondary | ICD-10-CM | POA: Diagnosis not present

## 2019-08-08 ENCOUNTER — Ambulatory Visit
Admission: RE | Admit: 2019-08-08 | Discharge: 2019-08-08 | Disposition: A | Discharge status: Home / Self Care | Payer: BC Managed Care – PPO | Source: Ambulatory Visit | Attending: Sports Medicine | Admitting: Sports Medicine

## 2019-08-08 ENCOUNTER — Other Ambulatory Visit: Payer: Self-pay

## 2019-08-08 ENCOUNTER — Ambulatory Visit: Payer: BC Managed Care – PPO | Admitting: Sports Medicine

## 2019-08-08 VITALS — BP 108/64 | Ht 63.0 in | Wt 125.0 lb

## 2019-08-08 DIAGNOSIS — M7742 Metatarsalgia, left foot: Secondary | ICD-10-CM

## 2019-08-08 DIAGNOSIS — M79674 Pain in right toe(s): Secondary | ICD-10-CM

## 2019-08-08 DIAGNOSIS — M25571 Pain in right ankle and joints of right foot: Secondary | ICD-10-CM

## 2019-08-08 DIAGNOSIS — M79672 Pain in left foot: Secondary | ICD-10-CM

## 2019-08-08 NOTE — Patient Instructions (Addendum)
It was great to meet you today! Thank you for letting me participate in your care!  Today, we discussed your left foot pain and it is unclear why you are still having this pain. We will get an MRI approved so that we can have more information before going forward with additional treatments.  We will also get an x-ray of your right ankle and right great toe to rule out stress fracture.  Be well, Jules Schick, DO PGY-4, Sports Medicine Fellow [facility]

## 2019-08-08 NOTE — Assessment & Plan Note (Signed)
Unclear etiology at this point but differential includes stress fracture of the metatarsal bones, morton's neuroma, vs metatarsal crowding. - MRI to evaluate nature of her forefoot pain. - Run only as tolerated and advise decreasing run amounts to where it is pain free until we obtain results of MRI - Pain control with OTC medications PRN - Follow up with [place] after MRI

## 2019-08-08 NOTE — Assessment & Plan Note (Addendum)
Given she has right great toe tenderness to palpation on the plantar side of the could be sesamoiditis vs sesamoid fracture vs stress fracture vs MTP arthritis - X-ray for further evaluation - Follow up after x-ray, can consider injection based on x-ray results

## 2019-08-08 NOTE — Progress Notes (Signed)
SUBJECTIVE:   CHIEF COMPLAINT / HPI:   Left Foot Pain [name] is a very pleasant 49 year old female who is an established patient at this practice presenting for follow-up due to left foot pain.  Her pain began back before March of this year and is mainly located in stays in the forefoot.  It is exacerbated by running and we have attempted several conservative treatments including orthotics at her last appointment.  She states that she has been wearing them and felt like it first her pain got a little bit better however now her pain is returning seems worse than before.  A few weeks ago she went running and usually she can run 8 to 10 miles without the pain starting however these most recent runs she has not been able to run 5 miles without extreme pain.  She describes the pain as a burning sensation and a stinging pain that again is diffuse in the forefoot.  Right Great Toe Pain Patient also has right great toe pain which continues to be an issue and is tender when she touches it.  However she states that it does not bother her when she runs and gives her no problems when she walks she only notices it when something pushes on it or puts pressure on the area.  This has been going on for several weeks but has not been getting worse.  PERTINENT  PMH / PSH: History of metatarsalgia in the left foot, history of ankle and foot pain  OBJECTIVE:   BP 108/64    Ht 5\' 3"  (1.6 m)    Wt 125 lb (56.7 kg)    BMI 22.14 kg/m   MSK: Ankle/Foot, Left: No visible erythema, swelling, ecchymosis, or bony deformity.  Notable pes planus/cavus deformity. Transverse arch grossly collapsed; Tender to palpation on the plantar aspect of the forefoot diffusely but most tender between 2nd and 3rd metatarsals, No evidence of tibiotalar deviation; Range of motion is full in all directions. Strength is 5/5 in all directions. No tenderness at the insertion of the Achilles tendon; No plantar calcaneal tenderness;  Tenderness at the distal metatarsals diffusely; Able to walk 4 steps.  Special Tests:   - Anterior Drawer test: Positive   - Talar Tilt test: NEG   - Syndesmotic Squeeze test: NEG Ankle/Foot, Right: No visible erythema, swelling, ecchymosis, or bony deformity. Notable tenderness to palpation at the right MTP joint and on the dorsum of the right MTP. Range of motion is full in all directions. Strength is 5/5 in all directions.   - Anterior Drawer test: Positive   - Talar Tilt test: NEG   ASSESSMENT/PLAN:   Metatarsalgia of left foot Unclear etiology at this point but differential includes stress fracture of the metatarsal bones, morton's neuroma, vs metatarsal crowding. - MRI to evaluate nature of her forefoot pain. - Run only as tolerated and advise decreasing run amounts to where it is pain free until we obtain results of MRI - Pain control with OTC medications PRN - Follow up with after MRI  Great toe pain, right Given she has right great toe tenderness to palpation on the plantar side of the could be sesamoiditis vs sesamoid fracture vs stress fracture vs MTP arthritis - X-ray for further evaluation - Follow up after x-ray, can consider injection based on x-ray results     [place], DO PGY-4, Sports Medicine Fellow [facility]  Patient seen and evaluated with the sports medicine fellow.  I agree with the above plan of care.  X-rays of the right ankle and right great toe are reviewed.  They are unremarkable.  We will proceed with an MRI of her left foot for her chronic undiagnosed forefoot pain.  Phone follow-up with those results when available.  We will delineate further treatment based on those results.

## 2019-08-15 ENCOUNTER — Telehealth: Payer: Self-pay | Admitting: Physician Assistant

## 2019-08-15 NOTE — Telephone Encounter (Signed)
Patient called in asking if she could get the MRI moved up on the 27th of August for her foot and she was requesting to move it up for [facility]. Thanks

## 2019-08-28 DIAGNOSIS — M79675 Pain in left toe(s): Secondary | ICD-10-CM | POA: Diagnosis not present

## 2019-08-28 DIAGNOSIS — R600 Localized edema: Secondary | ICD-10-CM | POA: Diagnosis not present

## 2019-08-28 DIAGNOSIS — M79672 Pain in left foot: Secondary | ICD-10-CM | POA: Diagnosis not present

## 2019-08-31 ENCOUNTER — Encounter: Payer: Self-pay | Admitting: Sports Medicine

## 2019-08-31 ENCOUNTER — Telehealth: Payer: Self-pay | Admitting: Sports Medicine

## 2019-08-31 NOTE — Telephone Encounter (Signed)
  I spoke with [name] on the phone today after reviewing MRI findings of her left foot.  The MRI is unremarkable.  No evidence of sesamoid stress fracture or joint effusions.  I explained to her that even a negative MRI is helpful in her case.  It means that we can continue to work with padding her forefoot to alleviate her metatarsalgia, and it means that she can continue running without fear of doing permanent damage to her foot.  She tells me that she went for a long run earlier today and her foot felt great. I've recommend that we take a simple watchful waiting approach for now.  She can always return to the office at a later date for [place] to continue with orthotic adjustment if needed.

## 2019-09-01 ENCOUNTER — Other Ambulatory Visit: Payer: BC Managed Care – PPO

## 2019-09-13 DIAGNOSIS — K5904 Chronic idiopathic constipation: Secondary | ICD-10-CM | POA: Diagnosis not present

## 2019-09-13 DIAGNOSIS — R140 Abdominal distension (gaseous): Secondary | ICD-10-CM | POA: Diagnosis not present

## 2019-10-24 DIAGNOSIS — L720 Epidermal cyst: Secondary | ICD-10-CM | POA: Diagnosis not present

## 2019-10-24 DIAGNOSIS — D2261 Melanocytic nevi of right upper limb, including shoulder: Secondary | ICD-10-CM | POA: Diagnosis not present

## 2019-10-24 DIAGNOSIS — D225 Melanocytic nevi of trunk: Secondary | ICD-10-CM | POA: Diagnosis not present

## 2019-10-24 DIAGNOSIS — D2271 Melanocytic nevi of right lower limb, including hip: Secondary | ICD-10-CM | POA: Diagnosis not present

## 2019-11-06 DIAGNOSIS — Z1329 Encounter for screening for other suspected endocrine disorder: Secondary | ICD-10-CM | POA: Diagnosis not present

## 2019-11-06 DIAGNOSIS — J4520 Mild intermittent asthma, uncomplicated: Secondary | ICD-10-CM | POA: Diagnosis not present

## 2019-11-06 DIAGNOSIS — K581 Irritable bowel syndrome with constipation: Secondary | ICD-10-CM | POA: Diagnosis not present

## 2019-11-06 DIAGNOSIS — Z23 Encounter for immunization: Secondary | ICD-10-CM | POA: Diagnosis not present

## 2019-11-06 DIAGNOSIS — M545 Low back pain, unspecified: Secondary | ICD-10-CM | POA: Diagnosis not present

## 2019-11-06 DIAGNOSIS — R5383 Other fatigue: Secondary | ICD-10-CM | POA: Diagnosis not present

## 2019-11-06 DIAGNOSIS — F5101 Primary insomnia: Secondary | ICD-10-CM | POA: Diagnosis not present

## 2019-12-04 DIAGNOSIS — Z136 Encounter for screening for cardiovascular disorders: Secondary | ICD-10-CM | POA: Diagnosis not present

## 2019-12-04 DIAGNOSIS — R0602 Shortness of breath: Secondary | ICD-10-CM | POA: Diagnosis not present

## 2019-12-04 DIAGNOSIS — M545 Low back pain, unspecified: Secondary | ICD-10-CM | POA: Diagnosis not present

## 2019-12-04 DIAGNOSIS — R5383 Other fatigue: Secondary | ICD-10-CM | POA: Diagnosis not present

## 2019-12-05 DIAGNOSIS — R0602 Shortness of breath: Secondary | ICD-10-CM | POA: Diagnosis not present

## 2020-01-01 ENCOUNTER — Other Ambulatory Visit: Payer: Self-pay | Admitting: *Deleted

## 2020-01-01 DIAGNOSIS — R002 Palpitations: Secondary | ICD-10-CM | POA: Diagnosis not present

## 2020-01-01 DIAGNOSIS — R0600 Dyspnea, unspecified: Secondary | ICD-10-CM | POA: Diagnosis not present

## 2020-01-01 DIAGNOSIS — R0789 Other chest pain: Secondary | ICD-10-CM | POA: Diagnosis not present

## 2020-01-01 DIAGNOSIS — R0609 Other forms of dyspnea: Secondary | ICD-10-CM

## 2020-01-01 DIAGNOSIS — R030 Elevated blood-pressure reading, without diagnosis of hypertension: Secondary | ICD-10-CM | POA: Diagnosis not present

## 2020-01-02 ENCOUNTER — Ambulatory Visit (INDEPENDENT_AMBULATORY_CARE_PROVIDER_SITE_OTHER): Payer: BC Managed Care – PPO | Admitting: Internal Medicine

## 2020-01-02 ENCOUNTER — Other Ambulatory Visit: Payer: Self-pay

## 2020-01-02 DIAGNOSIS — R0600 Dyspnea, unspecified: Secondary | ICD-10-CM

## 2020-01-02 DIAGNOSIS — R0609 Other forms of dyspnea: Secondary | ICD-10-CM

## 2020-01-02 LAB — PULMONARY FUNCTION TEST
DL/VA % pred: 118 %
DL/VA: 5.12 ml/min/mmHg/L
DLCO cor % pred: 124 %
DLCO cor: 25.36 ml/min/mmHg
DLCO unc % pred: 124 %
DLCO unc: 25.36 ml/min/mmHg
FEF 25-75 Post: 2.88 L/sec
FEF 25-75 Pre: 2.4 L/sec
FEF2575-%Change-Post: 19 %
FEF2575-%Pred-Post: 106 %
FEF2575-%Pred-Pre: 88 %
FEV1-%Change-Post: 4 %
FEV1-%Pred-Post: 100 %
FEV1-%Pred-Pre: 95 %
FEV1-Post: 2.72 L
FEV1-Pre: 2.6 L
FEV1FVC-%Change-Post: 5 %
FEV1FVC-%Pred-Pre: 98 %
FEV6-%Change-Post: 0 %
FEV6-%Pred-Post: 97 %
FEV6-%Pred-Pre: 98 %
FEV6-Post: 3.25 L
FEV6-Pre: 3.29 L
FEV6FVC-%Change-Post: 0 %
FEV6FVC-%Pred-Post: 102 %
FEV6FVC-%Pred-Pre: 102 %
FVC-%Change-Post: 0 %
FVC-%Pred-Post: 95 %
FVC-%Pred-Pre: 96 %
FVC-Post: 3.26 L
FVC-Pre: 3.29 L
Post FEV1/FVC ratio: 83 %
Post FEV6/FVC ratio: 100 %
Pre FEV1/FVC ratio: 79 %
Pre FEV6/FVC Ratio: 100 %
RV % pred: 99 %
RV: 1.72 L
TLC % pred: 102 %
TLC: 5.02 L

## 2020-01-02 NOTE — Progress Notes (Signed)
PFT done today. 

## 2020-01-04 DIAGNOSIS — R030 Elevated blood-pressure reading, without diagnosis of hypertension: Secondary | ICD-10-CM | POA: Diagnosis not present

## 2020-01-04 DIAGNOSIS — R002 Palpitations: Secondary | ICD-10-CM | POA: Diagnosis not present

## 2020-01-04 DIAGNOSIS — R0600 Dyspnea, unspecified: Secondary | ICD-10-CM | POA: Diagnosis not present

## 2020-01-04 DIAGNOSIS — R0789 Other chest pain: Secondary | ICD-10-CM | POA: Diagnosis not present

## 2020-01-25 DIAGNOSIS — H8110 Benign paroxysmal vertigo, unspecified ear: Secondary | ICD-10-CM | POA: Diagnosis not present

## 2020-01-25 DIAGNOSIS — J0100 Acute maxillary sinusitis, unspecified: Secondary | ICD-10-CM | POA: Diagnosis not present

## 2020-02-16 DIAGNOSIS — H8112 Benign paroxysmal vertigo, left ear: Secondary | ICD-10-CM | POA: Diagnosis not present

## 2020-02-16 DIAGNOSIS — R2689 Other abnormalities of gait and mobility: Secondary | ICD-10-CM | POA: Diagnosis not present

## 2020-02-16 DIAGNOSIS — Z9181 History of falling: Secondary | ICD-10-CM | POA: Diagnosis not present

## 2020-02-23 DIAGNOSIS — H8110 Benign paroxysmal vertigo, unspecified ear: Secondary | ICD-10-CM | POA: Diagnosis not present

## 2020-03-01 DIAGNOSIS — H8110 Benign paroxysmal vertigo, unspecified ear: Secondary | ICD-10-CM | POA: Diagnosis not present

## 2020-03-04 DIAGNOSIS — R000 Tachycardia, unspecified: Secondary | ICD-10-CM | POA: Diagnosis not present

## 2020-03-04 DIAGNOSIS — R079 Chest pain, unspecified: Secondary | ICD-10-CM | POA: Diagnosis not present

## 2020-03-04 DIAGNOSIS — R0600 Dyspnea, unspecified: Secondary | ICD-10-CM | POA: Diagnosis not present

## 2020-03-19 DIAGNOSIS — H8110 Benign paroxysmal vertigo, unspecified ear: Secondary | ICD-10-CM | POA: Diagnosis not present

## 2020-03-19 DIAGNOSIS — R0600 Dyspnea, unspecified: Secondary | ICD-10-CM | POA: Diagnosis not present

## 2020-03-19 DIAGNOSIS — R079 Chest pain, unspecified: Secondary | ICD-10-CM | POA: Diagnosis not present

## 2020-03-19 DIAGNOSIS — R000 Tachycardia, unspecified: Secondary | ICD-10-CM | POA: Diagnosis not present

## 2020-03-21 DIAGNOSIS — R931 Abnormal findings on diagnostic imaging of heart and coronary circulation: Secondary | ICD-10-CM | POA: Diagnosis not present

## 2020-04-18 DIAGNOSIS — M84375A Stress fracture, left foot, initial encounter for fracture: Secondary | ICD-10-CM | POA: Diagnosis not present

## 2020-04-18 DIAGNOSIS — M84374A Stress fracture, right foot, initial encounter for fracture: Secondary | ICD-10-CM | POA: Diagnosis not present

## 2020-04-25 DIAGNOSIS — M84375D Stress fracture, left foot, subsequent encounter for fracture with routine healing: Secondary | ICD-10-CM | POA: Diagnosis not present

## 2020-05-09 DIAGNOSIS — M84375D Stress fracture, left foot, subsequent encounter for fracture with routine healing: Secondary | ICD-10-CM | POA: Diagnosis not present

## 2020-05-20 DIAGNOSIS — M84375D Stress fracture, left foot, subsequent encounter for fracture with routine healing: Secondary | ICD-10-CM | POA: Diagnosis not present

## 2020-05-20 DIAGNOSIS — B351 Tinea unguium: Secondary | ICD-10-CM | POA: Diagnosis not present

## 2020-05-20 DIAGNOSIS — M84374D Stress fracture, right foot, subsequent encounter for fracture with routine healing: Secondary | ICD-10-CM | POA: Diagnosis not present

## 2020-05-30 DIAGNOSIS — M84375D Stress fracture, left foot, subsequent encounter for fracture with routine healing: Secondary | ICD-10-CM | POA: Diagnosis not present

## 2020-05-30 DIAGNOSIS — E559 Vitamin D deficiency, unspecified: Secondary | ICD-10-CM | POA: Diagnosis not present

## 2020-06-10 DIAGNOSIS — M84375D Stress fracture, left foot, subsequent encounter for fracture with routine healing: Secondary | ICD-10-CM | POA: Diagnosis not present

## 2020-06-10 DIAGNOSIS — T148XXA Other injury of unspecified body region, initial encounter: Secondary | ICD-10-CM | POA: Diagnosis not present

## 2020-06-19 DIAGNOSIS — M84375D Stress fracture, left foot, subsequent encounter for fracture with routine healing: Secondary | ICD-10-CM | POA: Diagnosis not present

## 2020-07-02 DIAGNOSIS — R0600 Dyspnea, unspecified: Secondary | ICD-10-CM | POA: Diagnosis not present

## 2020-07-02 DIAGNOSIS — R079 Chest pain, unspecified: Secondary | ICD-10-CM | POA: Diagnosis not present

## 2020-07-02 DIAGNOSIS — R000 Tachycardia, unspecified: Secondary | ICD-10-CM | POA: Diagnosis not present

## 2020-07-03 DIAGNOSIS — Z1231 Encounter for screening mammogram for malignant neoplasm of breast: Secondary | ICD-10-CM | POA: Diagnosis not present

## 2020-07-03 DIAGNOSIS — Z6823 Body mass index (BMI) 23.0-23.9, adult: Secondary | ICD-10-CM | POA: Diagnosis not present

## 2020-07-03 DIAGNOSIS — Z01419 Encounter for gynecological examination (general) (routine) without abnormal findings: Secondary | ICD-10-CM | POA: Diagnosis not present

## 2020-07-05 ENCOUNTER — Other Ambulatory Visit: Payer: Self-pay | Admitting: Obstetrics and Gynecology

## 2020-07-05 DIAGNOSIS — Z803 Family history of malignant neoplasm of breast: Secondary | ICD-10-CM

## 2020-07-11 DIAGNOSIS — Z13820 Encounter for screening for osteoporosis: Secondary | ICD-10-CM | POA: Diagnosis not present

## 2020-07-22 DIAGNOSIS — G5763 Lesion of plantar nerve, bilateral lower limbs: Secondary | ICD-10-CM | POA: Diagnosis not present

## 2020-07-22 DIAGNOSIS — M19072 Primary osteoarthritis, left ankle and foot: Secondary | ICD-10-CM | POA: Diagnosis not present

## 2020-07-22 DIAGNOSIS — M19071 Primary osteoarthritis, right ankle and foot: Secondary | ICD-10-CM | POA: Diagnosis not present

## 2020-07-29 DIAGNOSIS — G5763 Lesion of plantar nerve, bilateral lower limbs: Secondary | ICD-10-CM | POA: Diagnosis not present

## 2020-08-12 DIAGNOSIS — G5763 Lesion of plantar nerve, bilateral lower limbs: Secondary | ICD-10-CM | POA: Diagnosis not present

## 2020-08-27 ENCOUNTER — Other Ambulatory Visit: Payer: Self-pay

## 2020-08-27 ENCOUNTER — Ambulatory Visit (INDEPENDENT_AMBULATORY_CARE_PROVIDER_SITE_OTHER): Payer: BC Managed Care – PPO | Admitting: Sports Medicine

## 2020-08-27 ENCOUNTER — Ambulatory Visit
Admission: RE | Admit: 2020-08-27 | Discharge: 2020-08-27 | Disposition: A | Discharge status: Home / Self Care | Payer: BC Managed Care – PPO | Source: Ambulatory Visit | Attending: Sports Medicine | Admitting: Sports Medicine

## 2020-08-27 VITALS — Ht 63.0 in | Wt 130.0 lb

## 2020-08-27 DIAGNOSIS — M542 Cervicalgia: Secondary | ICD-10-CM

## 2020-08-27 MED ORDER — MELOXICAM 15 MG PO TABS: ORAL_TABLET | ORAL | 0 refills | Status: DC

## 2020-08-28 NOTE — Progress Notes (Addendum)
Subjective:    Patient ID: [name], female    DOB: 10-Apr-1970, 50 y.o.   MRN: [id number]  HPI chief complaint: Right-sided neck pain and bilateral foot pain  [name] presents today with a couple of different complaints.  First complaint is right-sided neck pain that began about 3 weeks ago without any known injury.  She initially thought that she had a crick in her neck but when her pain persisted she decided to schedule an appointment to have it evaluated.  Pain will occasionally rotate into the right trapezius but does not radiate down the right arm.  No associated numbness or tingling.  It is most noticeable with cervical rotation.  She notes pain and decreased cervical rotation when rotating to the right.  She is having some difficulty sleeping at night.  No prior neck surgery.  She denies deep-seated shoulder pain.  Second concern is her feet.  She was recently diagnosed with stress fractures in both feet.  She was placed into a walking boot for her left foot for approximately 8 weeks.  She was then able to resume walking up to 4 miles before adding in some run/walks.  However, she began to experience some right ankle pain along the distal fibula shortly thereafter.  She is concerned about reinjury.  She has really decreased the amount of running she is doing but she is okay with that for now.  She is able to substitute other exercises.  Past medical history reviewed Medications reviewed Allergies reviewed    Review of Systems As above    Objective:   Physical Exam  Well-developed, well-nourished.  No acute distress  Cervical spine: She has decreased cervical rotation of the right by about 50%.  This does reproduce some pain along the right side of the posterior neck.  Full cervical rotation to the left.  Good flexion and extension.  She is tender to palpation along the area of the facets on the right.  No spasm.  No focal neurological deficit of either upper  extremity.  Examination of the right ankle shows full range of motion.  No effusion.  No soft tissue swelling.  There is some slight tenderness to palpation along the distal fibular shaft just above the lateral malleolus.  Good pulses distally.  Examination of her feet in the standing position shows a fairly well-preserved longitudinal arch.  She does have some splaying of the first and second toes bilaterally consistent with some mild transverse arch collapse.  Evaluation of her gait shows a neutral gait both with walking and running but she does tend to run with a heavy foot strike.  No obvious limping.      Assessment & Plan:   Right-sided neck pain likely secondary to cervical facet arthropathy Reoccurring stress fractures, both feet and possible right distal fibular  For her neck pain, I would like to get a 2 view cervical spine specifically to evaluate the degree of facet arthropathy present.  She has been taking over-the-counter ibuprofen without any benefit so we will try meloxicam 15 mg a day for 7 days then as needed.  I recommended moist heat and she will start a home exercise program.  If symptoms do not improve quickly then consider a 6-day Sterapred Dosepak and formal physical therapy.  In regards to her bilateral foot stress fractures, I would like for her to return to the office in 2 weeks for new custom orthotics.  Her current custom orthotics are quite worn and I  think the cushioning she would get from a new pair of orthotics would be beneficial.  She will need to continue to restrict her running until seen at follow-up.  I did ask about a bone density test and it sounds like she may have had this done recently at another institution.  If so, she will get me those results.  I have also encouraged her to discuss calcium and vitamin D supplementation with her PCP.   Addendum (08/29/2020): X-rays reviewed.  There does appear to be some mild facet arthropathy on the AP view but no  significant degenerative disc disease.  Nothing acute.

## 2020-09-10 ENCOUNTER — Encounter: Payer: BC Managed Care – PPO | Admitting: Sports Medicine

## 2020-09-12 ENCOUNTER — Ambulatory Visit (INDEPENDENT_AMBULATORY_CARE_PROVIDER_SITE_OTHER): Payer: BC Managed Care – PPO | Admitting: Sports Medicine

## 2020-09-12 ENCOUNTER — Other Ambulatory Visit: Payer: Self-pay

## 2020-09-12 VITALS — Ht 63.0 in | Wt 130.0 lb

## 2020-09-12 DIAGNOSIS — R269 Unspecified abnormalities of gait and mobility: Secondary | ICD-10-CM

## 2020-09-12 NOTE — Assessment & Plan Note (Signed)
Patient was fitted for a : standard, cushioned, semi-rigid orthotic. The orthotic was heated and afterward the patient stood on the orthotic blank positioned on the orthotic stand. The patient was positioned in subtalar neutral position and 10 degrees of ankle dorsiflexion in a weight bearing stance. After completion of molding, a stable base was applied to the orthotic blank. The blank was ground to a stable position for weight bearing. Size: 7 Base: blue EVA Posting: none Additional orthotic padding: none  Total time spent with the patient was 30 minutes with greater than 50% of the time spent in face-to-face consultation discussing orthotic construction, instruction, and sitting. Gait was neutral with orthotics in place. Patient found them to be comfortable. Follow-up as needed.

## 2020-09-12 NOTE — Progress Notes (Signed)
   [name] is a 50 y.o. female who presents to [facility] today for the following:  Gait abnormality Hx bilateral foot stress fractures, last seen for this on 08/27/20 At this visit on 8/23 she was noted to have significant wear of her bilateral orthotics She presents today for new custom orthotics to increase cushion and hopefully help with bilateral foot stress fractures while she is running She denies any pain today    PMH reviewed.  ROS as above. Medications reviewed.  Exam:  Ht 5\' 3"  (1.6 m)   Wt 130 lb (59 kg)   BMI 23.03 kg/m  Gen: Well NAD MSK:  Bilateral Feet Inspection:  No obvious bony deformity b/l.  No swelling, erythema, or bruising b/l.  She has a healing blister on the arch of her right foot. Normal arch b/l Neurovascular: N/V intact distally in the lower extremity b/l   Assessment and Plan: 1) Abnormality of gait Patient was fitted for a : standard, cushioned, semi-rigid orthotic. The orthotic was heated and afterward the patient stood on the orthotic blank positioned on the orthotic stand. The patient was positioned in subtalar neutral position and 10 degrees of ankle dorsiflexion in a weight bearing stance. After completion of molding, a stable base was applied to the orthotic blank. The blank was ground to a stable position for weight bearing. Size: 7 Base: blue EVA Posting: none Additional orthotic padding: none  Total time spent with the patient was 30 minutes with greater than 50% of the time spent in face-to-face consultation discussing orthotic construction, instruction, and sitting. Gait was neutral with orthotics in place. Patient found them to be comfortable. Follow-up as needed.   , D.O.  PGY-4 [facility]  09/12/2020 4:15 PM

## 2020-09-20 DIAGNOSIS — E559 Vitamin D deficiency, unspecified: Secondary | ICD-10-CM | POA: Diagnosis not present

## 2020-09-20 DIAGNOSIS — Z13220 Encounter for screening for lipoid disorders: Secondary | ICD-10-CM | POA: Diagnosis not present

## 2020-09-20 DIAGNOSIS — Z1231 Encounter for screening mammogram for malignant neoplasm of breast: Secondary | ICD-10-CM | POA: Diagnosis not present

## 2020-09-20 DIAGNOSIS — Z0000 Encounter for general adult medical examination without abnormal findings: Secondary | ICD-10-CM | POA: Diagnosis not present

## 2020-10-25 DIAGNOSIS — G43009 Migraine without aura, not intractable, without status migrainosus: Secondary | ICD-10-CM | POA: Diagnosis not present

## 2020-10-29 DIAGNOSIS — J0101 Acute recurrent maxillary sinusitis: Secondary | ICD-10-CM | POA: Diagnosis not present

## 2020-10-29 DIAGNOSIS — M542 Cervicalgia: Secondary | ICD-10-CM | POA: Diagnosis not present

## 2020-11-01 DIAGNOSIS — L821 Other seborrheic keratosis: Secondary | ICD-10-CM | POA: Diagnosis not present

## 2020-11-01 DIAGNOSIS — D225 Melanocytic nevi of trunk: Secondary | ICD-10-CM | POA: Diagnosis not present

## 2020-11-01 DIAGNOSIS — C44519 Basal cell carcinoma of skin of other part of trunk: Secondary | ICD-10-CM | POA: Diagnosis not present

## 2020-11-01 DIAGNOSIS — D220 Melanocytic nevi of lip: Secondary | ICD-10-CM | POA: Diagnosis not present

## 2020-11-01 DIAGNOSIS — L720 Epidermal cyst: Secondary | ICD-10-CM | POA: Diagnosis not present

## 2020-11-14 ENCOUNTER — Ambulatory Visit (INDEPENDENT_AMBULATORY_CARE_PROVIDER_SITE_OTHER): Payer: BC Managed Care – PPO | Admitting: Sports Medicine

## 2020-11-14 VITALS — BP 122/82 | Ht 63.0 in | Wt 128.0 lb

## 2020-11-14 DIAGNOSIS — M542 Cervicalgia: Secondary | ICD-10-CM | POA: Diagnosis not present

## 2020-11-14 DIAGNOSIS — M25512 Pain in left shoulder: Secondary | ICD-10-CM | POA: Diagnosis not present

## 2020-11-14 MED ORDER — MELOXICAM 15 MG PO TABS: ORAL_TABLET | ORAL | 1 refills | Status: AC

## 2020-11-14 NOTE — Assessment & Plan Note (Signed)
Prior cervical spine films do not show significant degenerative changes, although is likely related to irritation at the facet level.  Unchanged from prior flares.  We will start with scheduled meloxicam for 7 days and then can use as needed.  Continue her home exercise program.  Call in 1 week if she is not improving would consider oral prednisone 6-day taper at that point.

## 2020-11-14 NOTE — Progress Notes (Signed)
   [name] is a 50 y.o. female who presents to [facility] today for the following:  Right posterior shoulder pain Last seen for the same in August Has been having a flareup of the same pain Changed 2 months ago to doing different workouts, is at [employer] now States that a couple weeks ago she was having such severe pain that she had limited range of motion with right rotation, but has been doing home exercises that she was previously giving and having some improvement with this  Left shoulder pain Has been having this for about 3 weeks Hurts worse when she is reaching backwards and doing overhead activity No injury Hurts when she rolls on it to sleep Has taken meloxicam a few times, but not consistently as she was worried that this would not be safe to take longer term, so it did not help much   PMH reviewed.  ROS as above. Medications reviewed.  Exam:  BP 122/82   Ht 5\' 3"  (1.6 m)   Wt 128 lb (58.1 kg)   BMI 22.67 kg/m  Gen: Well NAD MSK:  Left Shoulder: Inspection reveals no obvious deformity, atrophy, or asymmetry b/l. No bruising. No swelling Palpation is normal with no TTP over [facility] joint or bicipital groove b/l. Full ROM in flexion, abduction, internal/external rotation b/l, pain at extreme of all motion NV intact distally b/l Special Tests:  - Impingement: Positive Hawkins - Supraspinatous: Negative empty can - Infraspinatous/Teres Minor: 5/5 strength with ER - Subscapularis: 5/5 strength with IR - Biceps tendon: Negative Speeds - Labrum: Negative Obriens, good stability - AC Joint: Negative cross arm - Negative apprehension test - No painful arc and no drop arm sign  Neck/Back: - Inspection: no gross deformity or asymmetry, swelling or ecchymosis - Palpation: No TTP spinous process, TTP right rhomboid and trapezius with pressure point at mid scapula level on right - ROM: full active ROM of the cervical spine with neck extension, rotation, flexion - with right  sidebending - Strength: 5/5 wrist flexion, extension, biceps flexion, triceps extension.  - Neuro: sensation intact in the C5-C8 nerve root distribution b/l - Special testing: negative spurling's   No results found.   Assessment and Plan: 1) Neck pain Prior cervical spine films do not show significant degenerative changes, although is likely related to irritation at the facet level.  Unchanged from prior flares.  We will start with scheduled meloxicam for 7 days and then can use as needed.  Continue her home exercise program.  Call in 1 week if she is not improving would consider oral prednisone 6-day taper at that point.  Acute pain of left shoulder Reassured patient that there is no significant rotator cuff weakness.  Most likely impingement based on her examination.  We will treat with meloxicam per above.  Also given rotator cuff exercises for home exercise program and advised to avoid overhead weights and a skiing machine that she specifically described her workouts for the next 3 to 4 weeks.  If she is not having improvement, can return to care.   [facility], D.O.  PGY-4 [facility]  11/14/2020 3:28 PM  Patient seen and evaluated with the sports medicine fellow.  I agree with the above plan of care.  Follow-up for ongoing or recalcitrant issues.

## 2020-11-14 NOTE — Patient Instructions (Signed)
Thank you for coming to see me today. It was a pleasure. Today we talked about:   We have sent a prescription for meloxicam.  Take daly for a week, then as needed.  For the next 3-4 weeks, avoid the skiing machine and overhead weight.  Do the exercises that we gave you 4-5 times a week.    Call us in a week if you aren't improving with meloxicam and we can send in a steroid dosepak.    Please follow-up with [place] in 6 weeks if not improving.  If you have any questions or concerns, please do not hesitate to call the office at [phone].  Best,   Luis Abed, DO [facility]

## 2020-11-14 NOTE — Assessment & Plan Note (Signed)
Reassured patient that there is no significant rotator cuff weakness.  Most likely impingement based on her examination.  We will treat with meloxicam per above.  Also given rotator cuff exercises for home exercise program and advised to avoid overhead weights and a skiing machine that she specifically described her workouts for the next 3 to 4 weeks.  If she is not having improvement, can return to care.

## 2020-11-20 ENCOUNTER — Ambulatory Visit
Admission: RE | Admit: 2020-11-20 | Discharge: 2020-11-20 | Disposition: A | Discharge status: Home / Self Care | Payer: BC Managed Care – PPO | Source: Ambulatory Visit | Attending: Obstetrics and Gynecology | Admitting: Obstetrics and Gynecology

## 2020-11-20 ENCOUNTER — Other Ambulatory Visit: Payer: Self-pay

## 2020-11-20 DIAGNOSIS — Z803 Family history of malignant neoplasm of breast: Secondary | ICD-10-CM

## 2020-11-20 DIAGNOSIS — Z1239 Encounter for other screening for malignant neoplasm of breast: Secondary | ICD-10-CM | POA: Diagnosis not present

## 2020-11-20 MED ORDER — GADOBUTROL 1 MMOL/ML IV SOLN
6.0000 mL | Freq: Once | INTRAVENOUS | Status: AC | PRN
  Administered 2020-11-20: 6 mL via INTRAVENOUS

## 2020-11-21 ENCOUNTER — Other Ambulatory Visit: Payer: Self-pay

## 2020-11-21 ENCOUNTER — Encounter: Payer: Self-pay | Admitting: Sports Medicine

## 2020-11-21 ENCOUNTER — Ambulatory Visit: Payer: BC Managed Care – PPO | Admitting: Sports Medicine

## 2020-11-21 VITALS — BP 126/64 | Ht 63.0 in | Wt 128.0 lb

## 2020-11-21 DIAGNOSIS — M7542 Impingement syndrome of left shoulder: Secondary | ICD-10-CM

## 2020-11-21 DIAGNOSIS — M25512 Pain in left shoulder: Secondary | ICD-10-CM | POA: Diagnosis not present

## 2020-11-21 MED ORDER — METHYLPREDNISOLONE ACETATE 40 MG/ML IJ SUSP
40.0000 mg | Freq: Once | INTRAMUSCULAR | Status: AC
Start: 2020-11-21 — End: 2020-11-21
  Administered 2020-11-21: 11:00:00 40 mg via INTRA_ARTICULAR

## 2020-11-21 NOTE — Progress Notes (Signed)
Patient ID: [name], female   DOB: [birth year], 50 y.o.   MRN: [id number]  [name] presents today for a subacromial cortisone injection into her left shoulder.  Please see the office note from November 10 for details regarding history and physical exam findings regarding her left shoulder rotator cuff impingement.  Left subacromial space was injected as below.  She tolerates this without difficulty.  She will continue with her home exercises as well as avoiding repetitive overhead lifting.  If symptoms persist despite today's injection, we will consider imaging.  We will start with x-rays and ultrasound before MRI.  Consent obtained and verified. Time-out conducted. Noted no overlying erythema, induration, or other signs of local infection. Skin prepped in a sterile fashion. Topical analgesic spray: Ethyl chloride. Joint: Left shoulder, subacromial space Needle: 25-gauge 1.5 inch Completed without difficulty. Meds: 3 cc 1% Xylocaine, 1 cc (40 mg) Depo-Medrol  Advised to call if fevers/chills, erythema, induration, drainage, or persistent bleeding.

## 2020-12-11 ENCOUNTER — Ambulatory Visit: Payer: BC Managed Care – PPO | Admitting: Rehabilitative and Restorative Service Providers"

## 2020-12-11 ENCOUNTER — Other Ambulatory Visit: Payer: Self-pay

## 2020-12-11 ENCOUNTER — Encounter: Payer: Self-pay | Admitting: Rehabilitative and Restorative Service Providers"

## 2020-12-11 DIAGNOSIS — M25512 Pain in left shoulder: Secondary | ICD-10-CM | POA: Diagnosis not present

## 2020-12-11 DIAGNOSIS — M542 Cervicalgia: Secondary | ICD-10-CM | POA: Diagnosis not present

## 2020-12-11 DIAGNOSIS — M25511 Pain in right shoulder: Secondary | ICD-10-CM

## 2020-12-11 DIAGNOSIS — M6281 Muscle weakness (generalized): Secondary | ICD-10-CM

## 2020-12-11 DIAGNOSIS — G8929 Other chronic pain: Secondary | ICD-10-CM

## 2020-12-11 NOTE — Patient Instructions (Signed)
Access Code: [email or web address] URL: [email or web address] Date: 12/11/2020 Prepared by: Chyrel Masson  Exercises Shoulder External Rotation with Anchored Resistance - 2 x daily - 7 x weekly - 3 sets - 10 reps Prone Scapular Retraction - 2 x daily - 7 x weekly - 1 sets - 10 reps - 5 hold Prone Scapular Slide with Shoulder Extension - 2 x daily - 7 x weekly - 1 sets - 10 reps - 5 hold Prone Press Up On Elbows - 2 x daily - 7 x weekly - 1 sets - 10 reps - 5 hold

## 2020-12-12 NOTE — Therapy (Signed)
[facility] [address] [place], [place], [address] Phone: [phone]   Fax:  [phone]  Physical Therapy Evaluation  Patient Details  Name: [name] MRN: [id number] Date of Birth: [birth year] Referring Provider (PT): Ralene Cork, DO   Encounter Date: 12/11/2020   PT End of Session - 12/11/20 1600     Visit Number 1    Number of Visits 20    Date for PT Re-Evaluation 02/19/21    Authorization Type BCBS    PT Start Time 1603    PT Stop Time 1648    PT Time Calculation (min) 45 min    Activity Tolerance Patient tolerated treatment well    Behavior During Therapy [facility] for tasks assessed/performed             History reviewed. No pertinent past medical history.  History reviewed. No pertinent surgical history.  There were no vitals filed for this visit.    Subjective Assessment - 12/11/20 1604     Subjective Pt. indicated she switched to include ASI training in addition to her running workouts.  Pt. stated in last month the Lt shoulder has been bothering her.  Saw MD first with a few exercises and reduced overhead reaching activity.  Pt. indicated indicated that plus medicine didn't help too much.  Injection was performed without much improvement.  Illness led to reduced gym for a period of time that helped some.  Pt. stated still hurting reaching behind back and in gym activity.   Pt. also indicated she also had tightness on Rt side at the beginning of trouble but has improved some as well.  Computer work 8-10 hours per day.  Neck complaints in lower cervical and upper thoracic region as well at times.    Pertinent History injection shoulder 11/21/2020, familiar with dry needling    Limitations Lifting;Sitting;Other (comment)   gym activity   Diagnostic tests imaging showing degenerative cervical changes    Patient Stated Goals Reduce pain, get back to activity    Currently in Pain? Yes    Pain Score --   pain at worst  7/10   Pain Location Shoulder    Pain Orientation Left    Pain Descriptors / Indicators Other (Comment);Aching   ice cream headache   Pain Type Chronic pain    Pain Onset More than a month ago    Pain Frequency Intermittent    Aggravating Factors  nighttime sleeping at times, gym based activity, reaching behind back    Pain Relieving Factors rest    Effect of Pain on Daily Activities gym activity, dressing    Multiple Pain Sites Yes    Pain Score 5    Pain Location Neck    Pain Orientation Lower    Pain Descriptors / Indicators Crushing;Pressure    Pain Type Chronic pain    Pain Onset More than a month ago    Pain Frequency Intermittent    Aggravating Factors  gym activity ([place])    Pain Relieving Factors rest                [facility] PT Assessment - 12/11/20 0001       Assessment   Medical Diagnosis M75.42 (ICD-10-CM) - Rotator cuff impingement syndrome of left shoulder    Referring Provider (PT) Ralene Cork, DO    Onset Date/Surgical Date 11/05/20    Hand Dominance Right      Precautions   Precautions None  Restrictions   Weight Bearing Restrictions No      Balance Screen   Has the patient fallen in the past 6 months No    Has the patient had a decrease in activity level because of a fear of falling?  No    Is the patient reluctant to leave their home because of a fear of falling?  No      Home [employer] information [employer] residence    Living Arrangements Spouse/significant other    Additional Comments 2nd story (not for bedroom)      Prior Function   Level of Independence Independent    Vocation Requirements Desk job in [employer]    Leisure Routine runner, gym activity      Observation/Other Assessments   Focus on Therapeutic Outcomes (FOTO)  intake: 62  predicted: 69      Posture/Postural Control   Posture/Postural Control Postural limitations    Postural Limitations Rounded Shoulders   Mild FHP c increased cervical lordosis  noted.  More prominent C7 spinous process.  Protraction of scapulae noted in resting positioning   Posture Comments Winging scapulae noted in elevation attempts bilateral, Lt mild increase compared to Rt      ROM / Strength   AROM / PROM / Strength AROM;PROM;Strength      AROM   Overall AROM Comments Rt HBB T7, Lt HBB  c pain to T12.  Elevation AROM against gravity performed WFL bilateral with pain noted in Lt shoulder flexion, abduction and ER/IR end ranges.    AROM Assessment Site Cervical;Shoulder    Right/Left Shoulder Left;Right    Cervical Flexion 55   tightness   Cervical Extension 45    Cervical - Right Rotation 88    Cervical - Left Rotation 84   Tightness Lt cervical     PROM   Overall PROM Comments Relatively painfree passive mobility Lt glenohumral joint except end range discomfort IR.      Strength   Strength Assessment Site Elbow;Shoulder    Right/Left Shoulder Left;Right    Right Shoulder Flexion 5/5    Right Shoulder ABduction 5/5    Right Shoulder Internal Rotation 5/5    Right Shoulder External Rotation 5/5    Left Shoulder Flexion 4/5    Left Shoulder ABduction 4/5    Left Shoulder Internal Rotation 5/5    Left Shoulder External Rotation 4/5    Right/Left Elbow Right;Left    Right Elbow Flexion 5/5    Right Elbow Extension 5/5    Left Elbow Flexion 5/5    Left Elbow Extension 5/5      Palpation   Palpation comment Noted trigger points in Lt infraspinatus, supraspinatus, upper trap.  Present in Rt but much less than Lt.      Special Tests   Other special tests (+) Lt shoulder painful arc, hawkins kennedy.  (-) drop arm, lift off, apprehension                        Objective measurements completed on examination: See above findings.       [facility] Adult PT Treatment/Exercise - 12/11/20 0001       Exercises   Exercises Other Exercises    Other Exercises  HEP instruction/performance c cues for techniques, handout provided.  Trial set  performed of each for comprehension and symptom assessment.  HEP consiting of prone scapular retraction, scapular retraction c GH ext hold, prone on elbows SA press  hold, band ER in neutral standing      Manual Therapy   Manual therapy comments compression to Lt infraspinatus.  Palpation monitoring during DN.              Trigger Point Dry Needling - 12/12/20 0001     Consent Given? --    Education Handout Provided --    Muscles Treated Upper Quadrant --    Infraspinatus Response --                   PT Education - 12/11/20 1600     Education Details HEP, POC, DN information    Person(s) Educated Patient    Methods Explanation;Demonstration;Handout;Verbal cues    Comprehension Verbalized understanding;Returned demonstration              PT Short Term Goals - 12/11/20 1601       PT SHORT TERM GOAL #1   Title Patient will demonstrate independent use of home exercise program to maintain progress from in clinic treatments.    Time 3    Period Weeks    Status New    Target Date 01/01/21               PT Long Term Goals - 12/11/20 1601       PT LONG TERM GOAL #1   Title Patient will demonstrate/report pain at worst less than or equal to 2/10 to facilitate minimal limitation in daily activity secondary to pain symptoms.    Time 10    Period Weeks    Status New    Target Date 02/19/21      PT LONG TERM GOAL #2   Title Patient will demonstrate independent use of home exercise program to facilitate ability to maintain/progress functional gains from skilled physical therapy services.    Time 10    Period Weeks    Status New    Target Date 02/19/21      PT LONG TERM GOAL #3   Title Pt. will demonstrate FOTO outcome > or = 69 % to indicated reduced disability due to condition.    Time 10    Period Weeks    Status New    Target Date 02/19/21      PT LONG TERM GOAL #4   Title Patient will demonstrate bilateral UE MMT 5/5 throughout to facilitate  usual lifting, carrying in functional activity to PLOF s limitation.    Time 10    Period Weeks    Status New    Target Date 02/19/21      PT LONG TERM GOAL #5   Title Pt. will demonstrate Lt shoulder AROM equal to Rt s symptoms to facilitate usual movement at PLOF.    Time 10    Period Weeks    Status New    Target Date 02/20/21      Additional Long Term Goals   Additional Long Term Goals Yes      PT LONG TERM GOAL #6   Title Pt. will return to workout  activity at PLOF s limitation.    Time 10    Period Weeks    Status New    Target Date 02/20/21                    Plan - 12/11/20 1602     Clinical Impression Statement Patient is a 50 y.o. who comes to clinic with complaints of bilateral shoulder pain Lt.> Rt , cervical pain  with mobility, strength and movement coordination deficits that impair their ability to perform usual daily and recreational functional activities without increase difficulty/symptoms at this time.  Patient to benefit from skilled PT services to address impairments and limitations to improve to previous level of function without restriction secondary to condition.    Examination-Activity Limitations Carry;Lift;Reach Overhead    Examination-Participation Restrictions Community Activity;Other;Occupation   workouts   Stability/Clinical Decision Making Stable/Uncomplicated    Clinical Decision Making Low    Rehab Potential Good    PT Frequency Other (comment)   1-2x/week   PT Duration Other (comment)   10 weeks   PT Treatment/Interventions ADLs/Self Care Home Management;Cryotherapy;Electrical Stimulation;Iontophoresis /ml Dexamethasone;Moist Heat;Traction;Balance training;Therapeutic exercise;Therapeutic activities;Functional mobility training;Stair training;Gait training;DME Instruction;Ultrasound;Neuromuscular re-education;Patient/family education;Passive range of motion;Joint Manipulations;Dry needling;Spinal Manipulations;Taping;Manual techniques     PT Next Visit Plan DN if indicated/desired.  Progressive scapular and rotator cuff control strengthening.    PT Home Exercise Plan [id number]    Consulted and Agree with Plan of Care Patient             Patient will benefit from skilled therapeutic intervention in order to improve the following deficits and impairments:  Pain, Impaired UE functional use, Decreased activity tolerance, Decreased strength, Decreased mobility, Decreased range of motion, Impaired perceived functional ability, Improper body mechanics, Impaired flexibility, Decreased coordination, Hypomobility, Decreased endurance  Visit Diagnosis: Chronic left shoulder pain  Chronic right shoulder pain  Cervicalgia  Muscle weakness (generalized)     Problem List Patient Active Problem List   Diagnosis Date Noted   Neck pain 11/14/2020   Acute pain of left shoulder 11/14/2020   Abnormality of gait 09/12/2020   Great toe pain, right 08/08/2019   Endometriosis 08/02/2017   Primary insomnia 08/02/2017   Reactive airway disease without complication 08/02/2017   Antibiotic-induced yeast infection 03/31/2017   DOE (dyspnea on exertion) 03/08/2017   Ejection murmur 03/08/2017   Tachycardia 03/05/2017   Recurrent oral herpes simplex 03/03/2017   Metatarsalgia of left foot 09/07/2014   Pain in joint, ankle and foot 09/07/2014   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 [place], [place], [address] Phone: [phone]   Fax:  [phone]  Name: [name] MRN: [id number] Date of Birth: [birth year]

## 2020-12-16 ENCOUNTER — Encounter: Payer: Self-pay | Admitting: Sports Medicine

## 2020-12-17 DIAGNOSIS — J019 Acute sinusitis, unspecified: Secondary | ICD-10-CM | POA: Diagnosis not present

## 2020-12-24 ENCOUNTER — Encounter: Payer: Self-pay | Admitting: Rehabilitative and Restorative Service Providers"

## 2020-12-24 ENCOUNTER — Ambulatory Visit (INDEPENDENT_AMBULATORY_CARE_PROVIDER_SITE_OTHER): Payer: BC Managed Care – PPO | Admitting: Rehabilitative and Restorative Service Providers"

## 2020-12-24 ENCOUNTER — Other Ambulatory Visit: Payer: Self-pay

## 2020-12-24 DIAGNOSIS — M25511 Pain in right shoulder: Secondary | ICD-10-CM | POA: Diagnosis not present

## 2020-12-24 DIAGNOSIS — M25512 Pain in left shoulder: Secondary | ICD-10-CM

## 2020-12-24 DIAGNOSIS — M542 Cervicalgia: Secondary | ICD-10-CM | POA: Diagnosis not present

## 2020-12-24 DIAGNOSIS — M6281 Muscle weakness (generalized): Secondary | ICD-10-CM

## 2020-12-24 DIAGNOSIS — G8929 Other chronic pain: Secondary | ICD-10-CM

## 2020-12-24 NOTE — Therapy (Signed)
[facility] [address] [place], [place], [address] Phone: [phone]   Fax:  [phone]  Physical Therapy Treatment  Patient Details  Name: [name] MRN: [id number] Date of Birth: Oct 05, 1970 Referring Provider (PT): Ralene Cork, DO   Encounter Date: 12/24/2020   PT End of Session - 12/24/20 0800     Visit Number 2    Number of Visits 20    Date for PT Re-Evaluation 02/19/21    Authorization Type BCBS    PT Start Time 0758    PT Stop Time 0838    PT Time Calculation (min) 40 min    Activity Tolerance Patient tolerated treatment well    Behavior During Therapy [facility] for tasks assessed/performed             History reviewed. No pertinent past medical history.  History reviewed. No pertinent surgical history.  There were no vitals filed for this visit.   Subjective Assessment - 12/24/20 0801     Subjective Pt. indicated things seem to be a little bit better.  Pt. stated some aggravation yesterday but not specific recent.  Pt. indicated reaching behind back seemed to be better.   Pt. indicated continued workouts doing ok still modifying some movement.    Pertinent History injection shoulder 11/21/2020, familiar with dry needling    Limitations Lifting;Sitting;Other (comment)   gym activity   Diagnostic tests imaging showing degenerative cervical changes    Patient Stated Goals Reduce pain, get back to activity    Currently in Pain? Yes    Pain Score 3     Pain Location Shoulder    Pain Orientation Left    Pain Descriptors / Indicators Tender    Pain Type Chronic pain    Pain Onset More than a month ago    Pain Frequency Intermittent    Aggravating Factors  gym based activity, reaching back    Pain Relieving Factors rest, last visit seemed to help    Pain Onset More than a month ago                [facility] PT Assessment - 12/24/20 0001       Assessment   Medical Diagnosis M75.42 (ICD-10-CM) - Rotator cuff  impingement syndrome of left shoulder    Referring Provider (PT) Ralene Cork, DO    Onset Date/Surgical Date 11/05/20    Hand Dominance Right      AROM   Overall AROM Comments Lt HBB to T9                           [facility] Adult PT Treatment/Exercise - 12/24/20 0001       Exercises   Exercises Shoulder    Other Exercises  Review of existing HEP c cues      Shoulder Exercises: Prone   Other Prone Exercises prone over ball y, t x 15 bilateral c cues for home    Other Prone Exercises qaudruped SA press 5 sec hold x 10 c cues for home      Shoulder Exercises: Standing   Row 20 reps;Both   blue     Shoulder Exercises: ROM/Strengthening   UBE (Upper Arm Bike) Lvl 3 3 mins fwd/back each way      Manual Therapy   Manual therapy comments compression to Lt infraspinatus.  Palpation monitoring during DN.              Trigger  Point Dry Needling - 12/24/20 0001     Consent Given? Yes    Education Handout Provided No    Muscles Treated Upper Quadrant Infraspinatus   Lt   Infraspinatus Response Twitch response elicited                     PT Short Term Goals - 12/24/20 5956       PT SHORT TERM GOAL #1   Title Patient will demonstrate independent use of home exercise program to maintain progress from in clinic treatments.    Time 3    Period Weeks    Status On-going    Target Date 01/01/21               PT Long Term Goals - 12/11/20 1601       PT LONG TERM GOAL #1   Title Patient will demonstrate/report pain at worst less than or equal to 2/10 to facilitate minimal limitation in daily activity secondary to pain symptoms.    Time 10    Period Weeks    Status New    Target Date 02/19/21      PT LONG TERM GOAL #2   Title Patient will demonstrate independent use of home exercise program to facilitate ability to maintain/progress functional gains from skilled physical therapy services.    Time 10    Period Weeks    Status New     Target Date 02/19/21      PT LONG TERM GOAL #3   Title Pt. will demonstrate FOTO outcome > or = 69 % to indicated reduced disability due to condition.    Time 10    Period Weeks    Status New    Target Date 02/19/21      PT LONG TERM GOAL #4   Title Patient will demonstrate bilateral UE MMT 5/5 throughout to facilitate usual lifting, carrying in functional activity to PLOF s limitation.    Time 10    Period Weeks    Status New    Target Date 02/19/21      PT LONG TERM GOAL #5   Title Pt. will demonstrate Lt shoulder AROM equal to Rt s symptoms to facilitate usual movement at PLOF.    Time 10    Period Weeks    Status New    Target Date 02/20/21      Additional Long Term Goals   Additional Long Term Goals Yes      PT LONG TERM GOAL #6   Title Pt. will return to workout  activity at PLOF s limitation.    Time 10    Period Weeks    Status New    Target Date 02/20/21                   Plan - 12/24/20 0840     Clinical Impression Statement Strong twitches still noted from infraspinatus c concordant symptoms noted.  Improvement noted in gross mobility for behind back movement.  Continued progression on strengthening and movement coordination intervention for scapular and rotator cuff strengthening.    Examination-Activity Limitations Carry;Lift;Reach Overhead    Examination-Participation Restrictions Community Activity;Other;Occupation   workouts   Stability/Clinical Decision Making Stable/Uncomplicated    Rehab Potential Good    PT Frequency Other (comment)   1-2x/week   PT Duration Other (comment)   10 weeks   PT Treatment/Interventions ADLs/Self Care Home Management;Cryotherapy;Electrical Stimulation;Iontophoresis 4mg /ml Dexamethasone;Moist Heat;Traction;Balance training;Therapeutic exercise;Therapeutic activities;Functional mobility training;Stair training;Gait training;DME  Instruction;Ultrasound;Neuromuscular re-education;Patient/family education;Passive range of  motion;Joint Manipulations;Dry needling;Spinal Manipulations;Taping;Manual techniques    PT Next Visit Plan DN if necessary, continue strengthening progression.    PT Home Exercise Plan [id number]    Consulted and Agree with Plan of Care Patient             Patient will benefit from skilled therapeutic intervention in order to improve the following deficits and impairments:  Pain, Impaired UE functional use, Decreased activity tolerance, Decreased strength, Decreased mobility, Decreased range of motion, Impaired perceived functional ability, Improper body mechanics, Impaired flexibility, Decreased coordination, Hypomobility, Decreased endurance  Visit Diagnosis: Chronic left shoulder pain  Chronic right shoulder pain  Cervicalgia  Muscle weakness (generalized)     Problem List Patient Active Problem List   Diagnosis Date Noted   Neck pain 11/14/2020   Acute pain of left shoulder 11/14/2020   Abnormality of gait 09/12/2020   Great toe pain, right 08/08/2019   Endometriosis 08/02/2017   Primary insomnia 08/02/2017   Reactive airway disease without complication 08/02/2017   Antibiotic-induced yeast infection 03/31/2017   DOE (dyspnea on exertion) 03/08/2017   Ejection murmur 03/08/2017   Tachycardia 03/05/2017   Recurrent oral herpes simplex 03/03/2017   Metatarsalgia of left foot 09/07/2014   Pain in joint, ankle and foot 09/07/2014   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

## 2021-01-01 ENCOUNTER — Encounter: Payer: Self-pay | Admitting: Sports Medicine

## 2021-01-02 ENCOUNTER — Ambulatory Visit: Payer: BC Managed Care – PPO | Admitting: Rehabilitative and Restorative Service Providers"

## 2021-01-02 ENCOUNTER — Encounter: Payer: Self-pay | Admitting: Rehabilitative and Restorative Service Providers"

## 2021-01-02 ENCOUNTER — Other Ambulatory Visit: Payer: Self-pay

## 2021-01-02 DIAGNOSIS — M6281 Muscle weakness (generalized): Secondary | ICD-10-CM | POA: Diagnosis not present

## 2021-01-02 DIAGNOSIS — M25511 Pain in right shoulder: Secondary | ICD-10-CM | POA: Diagnosis not present

## 2021-01-02 DIAGNOSIS — M25512 Pain in left shoulder: Secondary | ICD-10-CM

## 2021-01-02 DIAGNOSIS — M542 Cervicalgia: Secondary | ICD-10-CM

## 2021-01-02 DIAGNOSIS — G8929 Other chronic pain: Secondary | ICD-10-CM

## 2021-01-02 NOTE — Therapy (Signed)
[facility] Physical Therapy [address] [place], [place], [address] Phone: [phone]   Fax:  [phone]  Physical Therapy Treatment  Patient Details  Name: [name] MRN: [id number] Date of Birth: 03-25-1970 Referring Provider (PT): Ralene Cork, DO   Encounter Date: 01/02/2021   PT End of Session - 01/02/21 0842     Visit Number 3    Number of Visits 20    Date for PT Re-Evaluation 02/19/21    Authorization Type BCBS    PT Start Time [phone]    PT Stop Time 0929    PT Time Calculation (min) 46 min    Activity Tolerance Patient tolerated treatment well    Behavior During Therapy [facility] for tasks assessed/performed             History reviewed. No pertinent past medical history.  History reviewed. No pertinent surgical history.  There were no vitals filed for this visit.   Subjective Assessment - 01/02/21 0846     Subjective Pt. indicated she was feeling pretty good until after class the other day that led to sore afterward.  Had deep tissue massage yesterday and sore from that as well.  Pt. indicated she is still avoiding overhead lifting at this time.    Pertinent History injection shoulder 11/21/2020, familiar with dry needling    Limitations Lifting;Sitting;Other (comment)   gym activity   Diagnostic tests imaging showing degenerative cervical changes    Patient Stated Goals Reduce pain, get back to activity    Currently in Pain? Yes    Pain Score 5     Pain Location Shoulder    Pain Orientation Left    Pain Descriptors / Indicators Sore    Pain Type Chronic pain    Pain Onset More than a month ago    Aggravating Factors  after exercise class    Pain Relieving Factors was doing better after treatment.    Pain Location Neck    Pain Orientation Lower;Left;Right    Pain Descriptors / Indicators Tightness    Pain Onset More than a month ago                [facility] PT Assessment - 01/02/21 0001       Assessment   Medical  Diagnosis M75.42 (ICD-10-CM) - Rotator cuff impingement syndrome of left shoulder    Referring Provider (PT) Ralene Cork, DO    Onset Date/Surgical Date 11/05/20    Hand Dominance Right      Strength   Left Shoulder Flexion 4+/5    Left Shoulder ABduction 4+/5                           [facility] Adult PT Treatment/Exercise - 01/02/21 0001       Shoulder Exercises: Standing   Extension Both;20 reps    Theraband Level (Shoulder Extension) Level 4 (Blue)    Row 20 reps;Both    Theraband Level (Shoulder Row) Level 4 (Blue)    Other Standing Exercises standing abudction 0-90 degrees bilateral c anterior green band resistance 2 x 10    Other Standing Exercises standing ER c flexion punch green band 3 x 10 bilateral      Shoulder Exercises: ROM/Strengthening   UBE (Upper Arm Bike) Lvl 4 3 mins fwd/back each way      Manual Therapy   Manual therapy comments compression to Lt infraspinatus, Lt upper trap.  Palpation monitoring during DN.  Trigger Point Dry Needling - 01/02/21 0001     Consent Given? Yes    Education Handout Provided No    Muscles Treated Head and Neck Upper trapezius   Lt   Muscles Treated Upper Quadrant Infraspinatus;Deltoid   Lt   Upper Trapezius Response Twitch reponse elicited    Infraspinatus Response Twitch response elicited    Deltoid Response Twitch response elicited   anterior deltoid                  PT Education - 01/02/21 0927     Education Details HEP progression    Person(s) Educated Patient    Methods Explanation;Demonstration;Verbal cues    Comprehension Returned demonstration;Verbalized understanding              PT Short Term Goals - 01/02/21 0848       PT SHORT TERM GOAL #1   Title Patient will demonstrate independent use of home exercise program to maintain progress from in clinic treatments.    Time 3    Period Weeks    Status Achieved    Target Date 01/01/21                PT Long Term Goals - 01/02/21 0848       PT LONG TERM GOAL #1   Title Patient will demonstrate/report pain at worst less than or equal to 2/10 to facilitate minimal limitation in daily activity secondary to pain symptoms.    Time 10    Period Weeks    Status On-going    Target Date 02/19/21      PT LONG TERM GOAL #2   Title Patient will demonstrate independent use of home exercise program to facilitate ability to maintain/progress functional gains from skilled physical therapy services.    Time 10    Period Weeks    Status On-going    Target Date 02/19/21      PT LONG TERM GOAL #3   Title Pt. will demonstrate FOTO outcome > or = 69 % to indicated reduced disability due to condition.    Time 10    Period Weeks    Status On-going    Target Date 02/19/21      PT LONG TERM GOAL #4   Title Patient will demonstrate bilateral UE MMT 5/5 throughout to facilitate usual lifting, carrying in functional activity to PLOF s limitation.    Time 10    Period Weeks    Status On-going    Target Date 02/19/21      PT LONG TERM GOAL #5   Title Pt. will demonstrate Lt shoulder AROM equal to Rt s symptoms to facilitate usual movement at PLOF.    Time 10    Period Weeks    Status On-going    Target Date 02/20/21      PT LONG TERM GOAL #6   Title Pt. will return to workout  activity at PLOF s limitation.    Time 10    Period Weeks    Status On-going    Target Date 02/20/21                   Plan - 01/02/21 0907     Clinical Impression Statement Quality of resisted static testing for flexion/abduction Lt shoulder was mildly improved.  Symptoms still noted in abduction MMT.  Twitch response c localized concordant symptoms from anterior deltoid and upper trap on Lt included in DN today.  Pt. to continue to  benefit from progressive strengthening program with dry needling techniques for symptom relief.    Examination-Activity Limitations Carry;Lift;Reach Overhead     Examination-Participation Restrictions Community Activity;Other;Occupation   workouts   Stability/Clinical Decision Making Stable/Uncomplicated    Rehab Potential Good    PT Frequency Other (comment)   1-2x/week   PT Duration Other (comment)   10 weeks   PT Treatment/Interventions ADLs/Self Care Home Management;Cryotherapy;Electrical Stimulation;Iontophoresis 4mg /ml Dexamethasone;Moist Heat;Traction;Balance training;Therapeutic exercise;Therapeutic activities;Functional mobility training;Stair training;Gait training;DME Instruction;Ultrasound;Neuromuscular re-education;Patient/family education;Passive range of motion;Joint Manipulations;Dry needling;Spinal Manipulations;Taping;Manual techniques    PT Next Visit Plan DN as desired.  Progress strengthening program to improve overhead lifting control.    PT Home Exercise Plan [email or web address]    Consulted and Agree with Plan of Care Patient             Patient will benefit from skilled therapeutic intervention in order to improve the following deficits and impairments:  Pain, Impaired UE functional use, Decreased activity tolerance, Decreased strength, Decreased mobility, Decreased range of motion, Impaired perceived functional ability, Improper body mechanics, Impaired flexibility, Decreased coordination, Hypomobility, Decreased endurance  Visit Diagnosis: Chronic left shoulder pain  Chronic right shoulder pain  Cervicalgia  Muscle weakness (generalized)     Problem List Patient Active Problem List   Diagnosis Date Noted   Neck pain 11/14/2020   Acute pain of left shoulder 11/14/2020   Abnormality of gait 09/12/2020   Great toe pain, right 08/08/2019   Endometriosis 08/02/2017   Primary insomnia 08/02/2017   Reactive airway disease without complication 08/02/2017   Antibiotic-induced yeast infection 03/31/2017   DOE (dyspnea on exertion) 03/08/2017   Ejection murmur 03/08/2017   Tachycardia 03/05/2017   Recurrent oral herpes simplex  03/03/2017   Metatarsalgia of left foot 09/07/2014   Pain in joint, ankle and foot 09/07/2014   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

## 2021-01-02 NOTE — Patient Instructions (Signed)
Access Code: [email or web address] URL: [email or web address] Date: 01/02/2021 Prepared by: Chyrel Masson  Exercises Shoulder External Rotation with Anchored Resistance - 2 x daily - 7 x weekly - 3 sets - 10 reps  (with flexion punch) Prone Scapular Retraction - 2 x daily - 7 x weekly - 1 sets - 10 reps - 5 hold Prone Scapular Slide with Shoulder Extension - 2 x daily - 7 x weekly - 1 sets - 10 reps - 5 hold Quadruped Scapular Protraction and Retraction - 2 x daily - 7 x weekly - 3 sets - 10 reps Prone Lower Trapezius Strengthening on Swiss Ball - 2 x daily - 7 x weekly - 3 sets - 10 reps Prone Middle Trapezius Strengthening on Swiss Ball - 2 x daily - 7 x weekly - 3 sets - 10 reps Standing Shoulder Row with Anchored Resistance - 1 x daily - 7 x weekly - 3 sets - 10 reps Shoulder Extension with Resistance - 1 x daily - 7 x weekly - 3 sets - 10 reps

## 2021-01-03 ENCOUNTER — Other Ambulatory Visit: Payer: Self-pay | Admitting: *Deleted

## 2021-01-03 ENCOUNTER — Ambulatory Visit
Admission: RE | Admit: 2021-01-03 | Discharge: 2021-01-03 | Disposition: A | Discharge status: Home / Self Care | Payer: BC Managed Care – PPO | Source: Ambulatory Visit | Attending: Sports Medicine | Admitting: Sports Medicine

## 2021-01-03 DIAGNOSIS — M25512 Pain in left shoulder: Secondary | ICD-10-CM

## 2021-01-03 NOTE — Progress Notes (Signed)
Order placed for left shoulder x-ray

## 2021-01-08 ENCOUNTER — Encounter: Payer: Self-pay | Admitting: Rehabilitative and Restorative Service Providers"

## 2021-01-08 ENCOUNTER — Ambulatory Visit (INDEPENDENT_AMBULATORY_CARE_PROVIDER_SITE_OTHER): Payer: BC Managed Care – PPO | Admitting: Rehabilitative and Restorative Service Providers"

## 2021-01-08 ENCOUNTER — Other Ambulatory Visit: Payer: Self-pay

## 2021-01-08 DIAGNOSIS — G8929 Other chronic pain: Secondary | ICD-10-CM

## 2021-01-08 DIAGNOSIS — M25512 Pain in left shoulder: Secondary | ICD-10-CM

## 2021-01-08 DIAGNOSIS — M25511 Pain in right shoulder: Secondary | ICD-10-CM

## 2021-01-08 DIAGNOSIS — M542 Cervicalgia: Secondary | ICD-10-CM | POA: Diagnosis not present

## 2021-01-08 DIAGNOSIS — M6281 Muscle weakness (generalized): Secondary | ICD-10-CM | POA: Diagnosis not present

## 2021-01-08 NOTE — Therapy (Addendum)
[facility] [address] [place], [place], [address] Phone: [phone]   Fax:  [phone]  Physical Therapy Treatment /Discharge   Patient Details  Name: [name] MRN: [id number] Date of Birth: 1970-07-04 Referring Provider (PT): Thurman Coyer, DO   Encounter Date: 01/08/2021   PT End of Session - 01/08/21 1015     Visit Number 4    Number of Visits 20    Date for PT Re-Evaluation 02/19/21    Authorization Type BCBS    PT Start Time 1015    Activity Tolerance Patient tolerated treatment well    Behavior During Therapy [facility] for tasks assessed/performed             History reviewed. No pertinent past medical history.  History reviewed. No pertinent surgical history.  There were no vitals filed for this visit.   Subjective Assessment - 01/08/21 1032     Subjective Pt. indicated feeling like last treatment helped improve symptoms.  Still avoiding some overhead activity due to anxiety about movement.    Pertinent History injection shoulder 11/21/2020, familiar with dry needling    Limitations Lifting;Sitting;Other (comment)   gym activity   Diagnostic tests imaging showing degenerative cervical changes    Patient Stated Goals Reduce pain, get back to activity    Currently in Pain? No/denies    Pain Score 0-No pain    Pain Onset More than a month ago    Pain Onset More than a month ago                               [facility] Adult PT Treatment/Exercise - 01/08/21 0001       Shoulder Exercises: Seated   Other Seated Exercises seated curl and overhead press 5 lbs 2 x 15      Shoulder Exercises: ROM/Strengthening   UBE (Upper Arm Bike) Lvl 4 3 mins fwd/back each way    Lat Pull Limitations x 15 20 lbs, x 15 25 lbs    Cybex Press Limitations c SA press 2 x 15 20 lbs      Manual Therapy   Manual therapy comments compression to Lt deltoid, Lt upper trap.  Palpation monitoring during DN.               Trigger Point Dry Needling - 01/08/21 0001     Consent Given? Yes    Education Handout Provided No    Muscles Treated Head and Neck Upper trapezius   Lt   Muscles Treated Upper Quadrant Deltoid   Lt anterior   Upper Trapezius Response Twitch reponse elicited    Deltoid Response Twitch response elicited                     PT Short Term Goals - 01/02/21 0848       PT SHORT TERM GOAL #1   Title Patient will demonstrate independent use of home exercise program to maintain progress from in clinic treatments.    Time 3    Period Weeks    Status Achieved    Target Date 01/01/21               PT Long Term Goals - 01/02/21 0848       PT LONG TERM GOAL #1   Title Patient will demonstrate/report pain at worst less than or equal to 2/10 to facilitate minimal limitation in daily activity secondary to  pain symptoms.    Time 10    Period Weeks    Status On-going    Target Date 02/19/21      PT LONG TERM GOAL #2   Title Patient will demonstrate independent use of home exercise program to facilitate ability to maintain/progress functional gains from skilled physical therapy services.    Time 10    Period Weeks    Status On-going    Target Date 02/19/21      PT LONG TERM GOAL #3   Title Pt. will demonstrate FOTO outcome > or = 69 % to indicated reduced disability due to condition.    Time 10    Period Weeks    Status On-going    Target Date 02/19/21      PT LONG TERM GOAL #4   Title Patient will demonstrate bilateral UE MMT 5/5 throughout to facilitate usual lifting, carrying in functional activity to PLOF s limitation.    Time 10    Period Weeks    Status On-going    Target Date 02/19/21      PT LONG TERM GOAL #5   Title Pt. will demonstrate Lt shoulder AROM equal to Rt s symptoms to facilitate usual movement at PLOF.    Time 10    Period Weeks    Status On-going    Target Date 02/20/21      PT LONG TERM GOAL #6   Title Pt. will return to workout   activity at PLOF s limitation.    Time 10    Period Weeks    Status On-going    Target Date 02/20/21                   Plan - 01/08/21 1041     Clinical Impression Statement Pt. to benefit from continued strengthening and coordination improvement interventions to faclitate overhead reaching/lifting return.    Examination-Activity Limitations Carry;Lift;Reach Overhead    Examination-Participation Restrictions Community Activity;Other;Occupation   workouts   Stability/Clinical Decision Making Stable/Uncomplicated    Rehab Potential Good    PT Frequency Other (comment)   1-2x/week   PT Duration Other (comment)   10 weeks   PT Treatment/Interventions ADLs/Self Care Home Management;Cryotherapy;Electrical Stimulation;Iontophoresis 30m/ml Dexamethasone;Moist Heat;Traction;Balance training;Therapeutic exercise;Therapeutic activities;Functional mobility training;Stair training;Gait training;DME Instruction;Ultrasound;Neuromuscular re-education;Patient/family education;Passive range of motion;Joint Manipulations;Dry needling;Spinal Manipulations;Taping;Manual techniques    PT Next Visit Plan DN as desired.  scapular strengthening, overhead control.    PT Home Exercise Plan [email or web address]    Consulted and Agree with Plan of Care Patient             Patient will benefit from skilled therapeutic intervention in order to improve the following deficits and impairments:  Pain, Impaired UE functional use, Decreased activity tolerance, Decreased strength, Decreased mobility, Decreased range of motion, Impaired perceived functional ability, Improper body mechanics, Impaired flexibility, Decreased coordination, Hypomobility, Decreased endurance  Visit Diagnosis: Chronic left shoulder pain  Chronic right shoulder pain  Cervicalgia  Muscle weakness (generalized)     Problem List Patient Active Problem List   Diagnosis Date Noted   Neck pain 11/14/2020   Acute pain of left shoulder  11/14/2020   Abnormality of gait 09/12/2020   Great toe pain, right 08/08/2019   Endometriosis 08/02/2017   Primary insomnia 08/02/2017   Reactive airway disease without complication [id number]  Antibiotic-induced yeast infection 03/31/2017   DOE (dyspnea on exertion) 03/08/2017   Ejection murmur 03/08/2017   Tachycardia 03/05/2017   Recurrent oral herpes simplex  03/03/2017   Metatarsalgia of left foot 09/07/2014   Pain in joint, ankle and foot 09/07/2014   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 Patient goals were partially met. Patient is being discharged due to being pleased with the current functional level. (Transition to HEP)  Scot Jun, PT, DPT, OCS, ATC 02/06/21  1:02 PM     [facility] Physical Therapy [address] [place], [place], [address] Phone: [phone]   Fax:  [phone]  Name: [name] MRN: [id number] Date of Birth: 1970/08/20

## 2021-01-15 ENCOUNTER — Encounter: Payer: BC Managed Care – PPO | Admitting: Rehabilitative and Restorative Service Providers"

## 2021-03-11 DIAGNOSIS — H40013 Open angle with borderline findings, low risk, bilateral: Secondary | ICD-10-CM | POA: Diagnosis not present

## 2021-03-11 DIAGNOSIS — H04123 Dry eye syndrome of bilateral lacrimal glands: Secondary | ICD-10-CM | POA: Diagnosis not present

## 2021-03-11 DIAGNOSIS — H5213 Myopia, bilateral: Secondary | ICD-10-CM | POA: Diagnosis not present

## 2021-07-02 DIAGNOSIS — R079 Chest pain, unspecified: Secondary | ICD-10-CM | POA: Diagnosis not present

## 2021-07-02 DIAGNOSIS — R000 Tachycardia, unspecified: Secondary | ICD-10-CM | POA: Diagnosis not present

## 2021-07-02 DIAGNOSIS — R030 Elevated blood-pressure reading, without diagnosis of hypertension: Secondary | ICD-10-CM | POA: Diagnosis not present

## 2021-07-02 DIAGNOSIS — R0789 Other chest pain: Secondary | ICD-10-CM | POA: Diagnosis not present

## 2021-07-15 DIAGNOSIS — Z136 Encounter for screening for cardiovascular disorders: Secondary | ICD-10-CM | POA: Diagnosis not present

## 2021-07-15 DIAGNOSIS — F5101 Primary insomnia: Secondary | ICD-10-CM | POA: Diagnosis not present

## 2021-07-15 DIAGNOSIS — Z1329 Encounter for screening for other suspected endocrine disorder: Secondary | ICD-10-CM | POA: Diagnosis not present

## 2021-07-15 DIAGNOSIS — K581 Irritable bowel syndrome with constipation: Secondary | ICD-10-CM | POA: Diagnosis not present

## 2021-07-15 DIAGNOSIS — R42 Dizziness and giddiness: Secondary | ICD-10-CM | POA: Diagnosis not present

## 2021-07-15 DIAGNOSIS — K599 Functional intestinal disorder, unspecified: Secondary | ICD-10-CM | POA: Diagnosis not present

## 2021-07-15 DIAGNOSIS — Z0000 Encounter for general adult medical examination without abnormal findings: Secondary | ICD-10-CM | POA: Diagnosis not present

## 2021-07-31 DIAGNOSIS — R140 Abdominal distension (gaseous): Secondary | ICD-10-CM | POA: Diagnosis not present

## 2021-07-31 DIAGNOSIS — K5900 Constipation, unspecified: Secondary | ICD-10-CM | POA: Diagnosis not present

## 2021-07-31 DIAGNOSIS — R110 Nausea: Secondary | ICD-10-CM | POA: Diagnosis not present

## 2021-07-31 DIAGNOSIS — K6389 Other specified diseases of intestine: Secondary | ICD-10-CM | POA: Diagnosis not present

## 2021-09-03 DIAGNOSIS — R7309 Other abnormal glucose: Secondary | ICD-10-CM | POA: Diagnosis not present

## 2021-09-03 DIAGNOSIS — R635 Abnormal weight gain: Secondary | ICD-10-CM | POA: Diagnosis not present

## 2021-09-03 DIAGNOSIS — Z130 Encounter for screening for diseases of the blood and blood-forming organs and certain disorders involving the immune mechanism: Secondary | ICD-10-CM | POA: Diagnosis not present

## 2021-09-03 DIAGNOSIS — E785 Hyperlipidemia, unspecified: Secondary | ICD-10-CM | POA: Diagnosis not present

## 2021-09-03 DIAGNOSIS — N951 Menopausal and female climacteric states: Secondary | ICD-10-CM | POA: Diagnosis not present

## 2021-09-03 DIAGNOSIS — Z6822 Body mass index (BMI) 22.0-22.9, adult: Secondary | ICD-10-CM | POA: Diagnosis not present

## 2021-09-03 DIAGNOSIS — Z1329 Encounter for screening for other suspected endocrine disorder: Secondary | ICD-10-CM | POA: Diagnosis not present

## 2021-09-09 DIAGNOSIS — Z1339 Encounter for screening examination for other mental health and behavioral disorders: Secondary | ICD-10-CM | POA: Diagnosis not present

## 2021-09-09 DIAGNOSIS — G4700 Insomnia, unspecified: Secondary | ICD-10-CM | POA: Diagnosis not present

## 2021-09-09 DIAGNOSIS — N951 Menopausal and female climacteric states: Secondary | ICD-10-CM | POA: Diagnosis not present

## 2021-09-09 DIAGNOSIS — K5900 Constipation, unspecified: Secondary | ICD-10-CM | POA: Diagnosis not present

## 2021-09-09 DIAGNOSIS — R5383 Other fatigue: Secondary | ICD-10-CM | POA: Diagnosis not present

## 2021-09-09 DIAGNOSIS — Z1331 Encounter for screening for depression: Secondary | ICD-10-CM | POA: Diagnosis not present

## 2021-10-27 DIAGNOSIS — Z01419 Encounter for gynecological examination (general) (routine) without abnormal findings: Secondary | ICD-10-CM | POA: Diagnosis not present

## 2021-10-27 DIAGNOSIS — Z6823 Body mass index (BMI) 23.0-23.9, adult: Secondary | ICD-10-CM | POA: Diagnosis not present

## 2021-10-27 DIAGNOSIS — Z124 Encounter for screening for malignant neoplasm of cervix: Secondary | ICD-10-CM | POA: Diagnosis not present

## 2021-10-27 DIAGNOSIS — Z1151 Encounter for screening for human papillomavirus (HPV): Secondary | ICD-10-CM | POA: Diagnosis not present

## 2021-10-27 DIAGNOSIS — Z1231 Encounter for screening mammogram for malignant neoplasm of breast: Secondary | ICD-10-CM | POA: Diagnosis not present

## 2021-11-12 DIAGNOSIS — G4700 Insomnia, unspecified: Secondary | ICD-10-CM | POA: Diagnosis not present

## 2021-11-12 DIAGNOSIS — R7989 Other specified abnormal findings of blood chemistry: Secondary | ICD-10-CM | POA: Diagnosis not present

## 2021-11-12 DIAGNOSIS — R5383 Other fatigue: Secondary | ICD-10-CM | POA: Diagnosis not present

## 2021-11-12 DIAGNOSIS — E559 Vitamin D deficiency, unspecified: Secondary | ICD-10-CM | POA: Diagnosis not present

## 2021-12-26 DIAGNOSIS — L821 Other seborrheic keratosis: Secondary | ICD-10-CM | POA: Diagnosis not present

## 2021-12-26 DIAGNOSIS — D1801 Hemangioma of skin and subcutaneous tissue: Secondary | ICD-10-CM | POA: Diagnosis not present

## 2021-12-26 DIAGNOSIS — C4441 Basal cell carcinoma of skin of scalp and neck: Secondary | ICD-10-CM | POA: Diagnosis not present

## 2021-12-26 DIAGNOSIS — D220 Melanocytic nevi of lip: Secondary | ICD-10-CM | POA: Diagnosis not present

## 2021-12-26 DIAGNOSIS — L2089 Other atopic dermatitis: Secondary | ICD-10-CM | POA: Diagnosis not present

## 2021-12-26 DIAGNOSIS — D225 Melanocytic nevi of trunk: Secondary | ICD-10-CM | POA: Diagnosis not present

## 2022-11-05 ENCOUNTER — Other Ambulatory Visit: Payer: Self-pay | Admitting: Obstetrics and Gynecology

## 2022-11-05 DIAGNOSIS — R928 Other abnormal and inconclusive findings on diagnostic imaging of breast: Secondary | ICD-10-CM

## 2022-11-17 ENCOUNTER — Ambulatory Visit
Admission: RE | Admit: 2022-11-17 | Discharge: 2022-11-17 | Disposition: A | Discharge status: Home / Self Care | Payer: BC Managed Care – PPO | Source: Ambulatory Visit | Attending: Obstetrics and Gynecology | Admitting: Obstetrics and Gynecology

## 2022-11-17 ENCOUNTER — Ambulatory Visit
Admission: RE | Admit: 2022-11-17 | Discharge: 2022-11-17 | Disposition: A | Discharge status: Home / Self Care | Payer: No Typology Code available for payment source | Source: Ambulatory Visit | Attending: Obstetrics and Gynecology | Admitting: Obstetrics and Gynecology

## 2022-11-17 DIAGNOSIS — R928 Other abnormal and inconclusive findings on diagnostic imaging of breast: Secondary | ICD-10-CM

## 2023-01-27 ENCOUNTER — Other Ambulatory Visit: Payer: Self-pay

## 2023-01-27 ENCOUNTER — Ambulatory Visit
Admission: RE | Admit: 2023-01-27 | Discharge: 2023-01-27 | Disposition: A | Discharge status: Home / Self Care | Payer: No Typology Code available for payment source | Source: Ambulatory Visit | Attending: Family Medicine

## 2023-01-27 ENCOUNTER — Encounter: Payer: Self-pay | Admitting: Family Medicine

## 2023-01-27 ENCOUNTER — Ambulatory Visit: Payer: No Typology Code available for payment source | Admitting: Family Medicine

## 2023-01-27 VITALS — BP 112/78 | Ht 63.0 in | Wt 125.0 lb

## 2023-01-27 DIAGNOSIS — M7582 Other shoulder lesions, left shoulder: Secondary | ICD-10-CM | POA: Insufficient documentation

## 2023-01-27 DIAGNOSIS — R202 Paresthesia of skin: Secondary | ICD-10-CM

## 2023-01-27 DIAGNOSIS — M25522 Pain in left elbow: Secondary | ICD-10-CM

## 2023-01-27 DIAGNOSIS — M7712 Lateral epicondylitis, left elbow: Secondary | ICD-10-CM | POA: Diagnosis not present

## 2023-01-27 NOTE — Assessment & Plan Note (Signed)
-   [name]'s hx and exam consistent with mild left rotator cuff tendonopathy - Limited US shows no tears but some hypoechoic changes within the supraspinatus specifically -She will start home exercises with ice and topical Voltaren gel. - We will follow-up in 4 to 6 weeks

## 2023-01-27 NOTE — Progress Notes (Unsigned)
TRACE HANKES - 53 y.o. female MRN [id number]  Date of birth: 1970-03-14  PCP: Rick Duff, PA-C  Subjective:  No chief complaint on file.  Left elbow pain  HPI: Past Medical, Surgical, Social, and Family History Reviewed & Updated per EMR.   Patient is a 53 y.o. female here for pain in her left lateral elbow that started a week ago after doing repetitive burpees and other upper body exercises as part of a work out.  She works out most days of the week and does not get a lot of rest.  Normally she does a CrossFit/circuit type of workout.  She has tried rest and some and anti-inflammatories which is only helped a little.  The pain is achy in quality and does not wake her from sleep.  She does however have some bilateral hand numbness in the mornings and does endorse a history of neck pain.  She also has some mild pain over the left lateral shoulder.   History reviewed. No pertinent surgical history.  Allergies  Allergen Reactions   Codeine Nausea Only        Objective:  Physical Exam: VS: BP:112/78  HR: bpm  TEMP: ( )  RESP:   HT:5\' 3"  (160 cm)   WT:125 lb (56.7 kg)  BMI:22.15  Gen: Well developed, NAD, speaks clearly, comfortable in exam room Respiratory: Normal work of breathing on room air, no respiratory distress Skin: No rashes, abrasions, or ecchymosis MSK:  Neck: Inspection normal, no JVD No palpable stepoffs. non TTP  Spurling's maneuver: negative Full neck ROM.   Grip strength and sensation normal in bilateral hands  Left Shoulder: Inspection normal ROM: full Strength: 5/5 in all directions AC joint: mildly tender Special tests: empty can, hawkins, and neer's positive Crossover and O'brien's negative  Left elbow No gross bony abnormality Ecchymosis or edema: none ROM: full Strength: Flexion 5/5, Extension 5/5, Supination 5/5, Pronation 5/5, Grip 5/5 Pain elicited w/ resisted supination and with grip over the lateral elbow ECRB tenderness:  neg Medial epicondyle: NT Lateral epicondyle: tender w/ palpation but minimally with resisted wrist extension Valgus stress: stable  Tinnel's negative at the wrist  Limited US of the left elbow:  The lateral epicondyle is visualized in LAX and SA X. There are hypoechoic changes in the distalmost portion of the ECR as well as some hypoechoic changes minimally in the muscle belly itself. There are small osteophytes on the lateral epicondyle and radial head with some overlying hypoechoic fluid There is hypoechoic changes of the soft tissue directly overlying the proximal radius distal to the radial head There is hypoechoic fluid over the lateral epicondyle seen in elbow flexion There is also a small area of hypoechoic change over the lateral joint line as well as within the proximal ECR tendon. No frank tears or defects of the ostium.  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 Webb Silversmith and Dr. Christella Hartigan   Assessment & Plan:   Lateral epicondylitis of left elbow - Hx and exam today with findings as noted above.  -There is a combination of lateral epicondylitis with some osteoarthritis of the radial head seen on ultrasound as above. - Dx and tx options discussed and the pt would like to proceed with home exercises, ice, oral naproxen for 2-3 weeks, and a tennis elbow strap which the patient will pick up on her own. -If there is no improvement we can consider shockwave therapy versus injection. -  f/u 4-6 for re-evaluation.  If improved she can continue her exercises and therapy as needed.   Rotator cuff tendonitis, left - Camylle's hx and exam consistent with mild left rotator cuff tendonopathy - Limited US shows no tears but some hypoechoic changes within the supraspinatus specifically -She will start home exercises with ice and topical Voltaren gel. - We will follow-up in 4 to 6 weeks   Paresthesia of both  hands - The patient does report some neck pain.  She has some repetitive typing at home and is very active.  Bilateral carpal tunnel cannot be ruled out, however given her symptoms being bilateral we will get a neck x-ray. - We will follow-up with the results    Rica Mote MD [facility] Sports Medicine Fellow

## 2023-01-27 NOTE — Assessment & Plan Note (Signed)
-   The patient does report some neck pain.  She has some repetitive typing at home and is very active.  Bilateral carpal tunnel cannot be ruled out, however given her symptoms being bilateral we will get a neck x-ray. - We will follow-up with the results

## 2023-01-27 NOTE — Patient Instructions (Signed)
You have rotator cuff tendonitis Try to avoid painful activities (overhead activities, lifting with extended arm) as much as possible. You can take Naproxen 500mg  twice daily with food for pain and inflammation as needed. Can take tylenol in addition to this. Subacromial injection may be beneficial to help with pain and to decrease inflammation if you  Do home exercise program with theraband and scapular stabilization exercises daily 3 sets of 10 once a day. If not improving at follow-up we will consider further imaging, injection, physical therapy, and/or nitro patches. Follow up with me in 4-6 weeks.  You also have lateral epicondylitis Try to avoid painful activities as much as possible. Ice the area 3-4 times a day for 15 minutes at a time. Tylenol or aleve as needed for pain. Counterforce brace as directed can help unload area - wear this regularly if it provides you with relief. (Tennis elbow strap) Do home exercises as directed. We can consider physical therapy, nitro patches, shockwave therapy if not improving.

## 2023-01-27 NOTE — Assessment & Plan Note (Signed)
-   Hx and exam today with findings as noted above.  -There is a combination of lateral epicondylitis with some osteoarthritis of the radial head seen on ultrasound as above. - Dx and tx options discussed and the pt would like to proceed with home exercises, ice, oral naproxen for 2-3 weeks, and a tennis elbow strap which the patient will pick up on her own. -If there is no improvement we can consider shockwave therapy versus injection. - f/u 4-6 for re-evaluation.  If improved she can continue her exercises and therapy as needed.

## 2023-02-08 ENCOUNTER — Encounter: Payer: Self-pay | Admitting: Family Medicine

## 2023-02-24 ENCOUNTER — Ambulatory Visit: Payer: No Typology Code available for payment source | Admitting: Family Medicine

## 2023-03-03 ENCOUNTER — Ambulatory Visit: Payer: No Typology Code available for payment source | Admitting: Family Medicine

## 2023-03-04 ENCOUNTER — Ambulatory Visit: Payer: No Typology Code available for payment source | Admitting: Family Medicine

## 2023-03-04 ENCOUNTER — Encounter: Payer: Self-pay | Admitting: Family Medicine

## 2023-03-04 ENCOUNTER — Other Ambulatory Visit: Payer: Self-pay | Admitting: Family Medicine

## 2023-03-04 VITALS — BP 110/68 | Ht 63.0 in | Wt 126.0 lb

## 2023-03-04 DIAGNOSIS — M5412 Radiculopathy, cervical region: Secondary | ICD-10-CM

## 2023-03-04 DIAGNOSIS — M7712 Lateral epicondylitis, left elbow: Secondary | ICD-10-CM

## 2023-03-04 MED ORDER — METHYLPREDNISOLONE ACETATE 40 MG/ML IJ SUSP
80.0000 mg | Freq: Once | INTRAMUSCULAR | Status: AC
Start: 2023-03-04 — End: 2023-03-04
  Administered 2023-03-04: 80 mg via INTRAMUSCULAR

## 2023-03-04 MED ORDER — PREDNISONE 10 MG PO TABS: ORAL_TABLET | ORAL | 0 refills | Status: AC

## 2023-03-04 MED ORDER — KETOROLAC TROMETHAMINE 60 MG/2ML IM SOLN
60.0000 mg | Freq: Once | INTRAMUSCULAR | Status: AC
Start: 2023-03-04 — End: 2023-03-04
  Administered 2023-03-04: 60 mg via INTRAMUSCULAR

## 2023-03-04 MED ORDER — DENOSUMAB 60 MG/ML ~~LOC~~ SOSY: 60.0000 mg | PREFILLED_SYRINGE | Freq: Once | SUBCUTANEOUS | Status: DC

## 2023-03-04 NOTE — Progress Notes (Signed)
 DATE OF VISIT: 03/04/2023    [name] DOB: 1970-04-14 MRN: [id number]  CC:  f/u Lt arm pain  History of present Illness: [name] is a 53 y.o. female who presents for a follow-up visit for Lt arm pain Last seen 01/27/23 by Dr Webb Silversmith in our office - dx with lateral epicondylitis with some OA of the radial head, possible RTC tendonitis, and possible radiculopathy - MSK U/S last visit showing tendinopathy of common extensor tendon on the left elbow without tearing - given HEP - recommended counterforce brace  Feeling somewhat better, but still having arm pain - feels up to 65% improved Having pain mainly along the left forearm/elbow/distal upper arm Still with numbness/tingling into the hands b/l Having ongoing neck tightness and stiffness Lt shoulder feeling better Has been resting from upper body lifting/exercise since last visit Has needed to do body weight when working out lower body to unload the arms Limited improvement with Mobic  Medications:  Outpatient Encounter Medications as of 03/04/2023  Medication Sig   predniSONE (DELTASONE) 10 MG tablet Take as directed per MD instructions   acetaminophen (TYLENOL) 325 MG tablet Take by mouth.   albuterol (PROVENTIL HFA;VENTOLIN HFA) 108 (90 Base) MCG/ACT inhaler Inhale into the lungs.   fluticasone (FLONASE) 50 MCG/ACT nasal spray Place into the nose.   meloxicam (MOBIC) 15 MG tablet Take 1 tablet daily with food for 7 days. Then take as needed.   montelukast (SINGULAIR) 10 MG tablet Take by mouth.   Multiple Vitamins-Minerals (MULTIVITAMIN WITH MINERALS) tablet Take by mouth.   norethindrone-ethinyl estradiol (JUNEL FE,GILDESS FE,LOESTRIN FE) 1-20 MG-MCG tablet Take 1 tablet by mouth.   valACYclovir (VALTREX) 500 MG tablet TK 1 T PO  BID   [EXPIRED] ketorolac (TORADOL) injection 60 mg    [EXPIRED] methylPREDNISolone acetate (DEPO-MEDROL) injection 80 mg    [DISCONTINUED] denosumab (PROLIA) injection 60 mg    No  facility-administered encounter medications on file as of 03/04/2023.    Allergies: is allergic to codeine.  Physical Examination: Vitals: BP 110/68   Ht 5\' 3"  (1.6 m)   Wt 126 lb (57.2 kg)   BMI 22.32 kg/m  GENERAL:  [name] is a 53 y.o. female appearing their stated age, alert and oriented x 3, in no apparent distress.  SKIN: no rashes or lesions, skin clean, dry, intact MSK: C-spine: No gross deformity.  Slightly decreased range of motion with some pain with rotation.  No midline tenderness.  Bilateral paraspinal tenderness left greater than right with associated hypertonia extending into the trapezius bilaterally left greater than right.  Negative Spurling's bilaterally  Shoulder: Left shoulder without any gross deformity.  Mild hypertonia along the posterior shoulder.  Tender to palpation along the bicipital groove, greater tuberosity, posterior shoulder.  Rotator cuff strength 5/5 throughout.  Negative Neer, mildly positive Hawkins, negative empty can.  Right shoulder with good range of motion without pain.  No tenderness to palpation.  Strength 5/5 throughout.  Elbow: Left elbow without gross deformity.  Full range of motion without pain.  Mild crepitus at the radiocapitellar joint with supination and pronation.  Tender to palpation along the lateral epicondyle and extensor bundle.  Mild tenderness along the flexor bundle.  Pain with resisted wrist extension, resisted third finger extension, no pain with wrist flexion. Right elbow with full range of motion without pain or crepitus.  No tenderness to palpation.  No pain with resisted wrist extension, resisted third finger extension, resisted wrist flexion. Normal grip strength  bilaterally. NEURO: sensation intact to light touch, DTR 2/4 bicep, tricep, brachioradialis bilaterally VASC: pulses 2+ and symmetric artery bilaterally, no edema  Radiology: XRAY:  Cspine XR 01/27/23 personally reviewed and interpreted by me today  showing: - flattening of cervical lordosis, no acute bony abnormalities  Assessment & Plan Lateral epicondylitis of left elbow Ongoing left arm pain primarily located from the forearm to the distal upper arm.  Exam findings most consistent with lateral epicondylitis.  I suspect surrounding symptoms related to compensation, but may have associated left-sided cervical radiculopathy as well -Feeling up to 65% improved, but still symptomatic  Plan: -Diagnosis and treatment discussed in detail -Given IM Toradol 60 mg and Depo-Medrol 80 mg in the office today -Rx 6-day prednisone taper to take as directed starting tomorrow.  Should take with food.  Should not take with other NSAIDs -Continue home exercise program as she is doing.  Also placed formal referral to [facility] PT at her request. -Continue counterforce brace with activity -Follow-up 4 to 6 weeks with me or Dr. Webb Silversmith, sooner as needed. Left cervical radiculopathy Ongoing left arm pain with likely underlying lateral epicondylitis, but symptoms also consistent with possible left-sided cervical radiculopathy.  She did have similar symptoms several years ago and was evaluated by [place] at that time  Plan: -C-spine x-rays reviewed as noted above -Diagnosis and treatment discussed in detail -Given IM Toradol 60 mg and Depo-Medrol 80 mg in the office today -Rx 6-day prednisone taper to take as directed starting tomorrow.  Should take with food.  Should not take with other NSAIDs -Placed formal referral to [facility] PT today -Follow-up 4 to 6 weeks with me or Dr. Webb Silversmith, sooner as needed.  Consider advanced imaging with MRI if not improving or if symptoms worsening Patient expressed understanding & agreement with above.  Encounter Diagnoses  Name Primary?   Lateral epicondylitis of left elbow Yes   Left cervical radiculopathy     Orders Placed This Encounter  Procedures   Ambulatory referral to Physical Therapy

## 2023-03-04 NOTE — Assessment & Plan Note (Signed)
 Ongoing left arm pain primarily located from the forearm to the distal upper arm.  Exam findings most consistent with lateral epicondylitis.  I suspect surrounding symptoms related to compensation, but may have associated left-sided cervical radiculopathy as well -Feeling up to 65% improved, but still symptomatic  Plan: -Diagnosis and treatment discussed in detail -Given IM Toradol 60 mg and Depo-Medrol 80 mg in the office today -Rx 6-day prednisone taper to take as directed starting tomorrow.  Should take with food.  Should not take with other NSAIDs -Continue home exercise program as she is doing.  Also placed formal referral to [facility] PT at her request. -Continue counterforce brace with activity -Follow-up 4 to 6 weeks with me or Dr. Webb Silversmith, sooner as needed.

## 2023-03-26 ENCOUNTER — Ambulatory Visit: Attending: Family Medicine | Admitting: Physical Therapy

## 2023-03-26 ENCOUNTER — Other Ambulatory Visit: Payer: Self-pay

## 2023-03-26 ENCOUNTER — Encounter: Payer: Self-pay | Admitting: Physical Therapy

## 2023-03-26 DIAGNOSIS — M6281 Muscle weakness (generalized): Secondary | ICD-10-CM | POA: Insufficient documentation

## 2023-03-26 DIAGNOSIS — M25522 Pain in left elbow: Secondary | ICD-10-CM | POA: Insufficient documentation

## 2023-03-26 DIAGNOSIS — M5412 Radiculopathy, cervical region: Secondary | ICD-10-CM | POA: Diagnosis not present

## 2023-03-26 DIAGNOSIS — M7712 Lateral epicondylitis, left elbow: Secondary | ICD-10-CM | POA: Diagnosis not present

## 2023-03-26 NOTE — Therapy (Signed)
 OUTPATIENT PHYSICAL THERAPY SHOULDER EVALUATION   Patient Name: [name] MRN: [id number] DOB:1971-01-05, 53 y.o., female Today's Date: 03/26/2023   PT End of Session - 03/26/23 1124     Visit Number 1    Number of Visits 1    PT Start Time 1045    PT Stop Time 1120    PT Time Calculation (min) 35 min             History reviewed. No pertinent past medical history. History reviewed. No pertinent surgical history. Patient Active Problem List   Diagnosis Date Noted   Lateral epicondylitis of left elbow 01/27/2023   Rotator cuff tendonitis, left 01/27/2023   Paresthesia of both hands 01/27/2023   Neck pain 11/14/2020   Acute pain of left shoulder 11/14/2020   Abnormality of gait 09/12/2020   Great toe pain, right 08/08/2019   Endometriosis 08/02/2017   Primary insomnia 08/02/2017   Reactive airway disease without complication 08/02/2017   Antibiotic-induced yeast infection 03/31/2017   DOE (dyspnea on exertion) 03/08/2017   Ejection murmur 03/08/2017   Tachycardia 03/05/2017   Recurrent oral herpes simplex 03/03/2017   Metatarsalgia of left foot 09/07/2014   Pain in joint, ankle and foot 09/07/2014   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 No, Number of falls: 0  MOI/History of condition:  Onset date: > 2 months  SUBJECTIVE STATEMENT  [name] is a 53 y.o. female who presents to clinic with chief  complaint of L shoulder pain and L lateral elbow pain.  She reports that she works out quite a bit and was doing a burpee variation and had sever pain from her L shoulder to her forearm.  She does have some intermittent n/t in bil hands, mostly at night.  It has improved significantly from the initial injury.  She denies any gross weakness   Red flags:  denies   Pain:  Are you having pain? Yes Pain location: L shoulder and elbow pain NPRS scale:  0/10 to 2/10 Aggravating factors: OH reaching Relieving factors: rest Pain description: aching Stage: Subacute 24 hour pattern: no clear pattern   Occupation:  computer job  Assistive Device: NA  Hand Dominance: R  Patient Goals/Specific Activities: ensure that she is safe to return to exercise   OBJECTIVE:   DIAGNOSTIC FINDINGS:  Neck x-ray - clear  GENERAL OBSERVATION: WNL     SENSATION: Light touch: Appears intact   PALPATION: Significant TTP extensor bundle L elbow  Cervical ROM  ROM ROM  (Eval)  Flexion n  Extension n  Right lateral flexion n  Left lateral flexion n  Right rotation n  Left rotation n  Flexion rotation (normal is 30 degrees)   Flexion rotation (normal is 30 degrees)     (Blank rows = not tested, N = WNL, * = concordant pain)   UPPER EXTREMITY AROM:  ROM Right (Eval) Left (Eval)  Shoulder flexion n n  Shoulder abduction n n  Shoulder internal rotation    Shoulder external rotation    Functional IR n n  Functional ER n n  Shoulder extension    Elbow extension    Elbow flexion     (Blank rows = not tested, N = WNL, * = concordant pain with testing)  UPPER EXTREMITY MMT:  MMT Right (Eval) Left (Eval)  Shoulder flexion 4+ 4+  Shoulder abduction (C5)    Shoulder ER 4+ 4+  Shoulder IR 4+ 4+  Middle trapezius    Wrist flexion    Wrist extension 4+ 4+*  Grip strength    Cervical flexion (C1,C2)    Cervical S/B (C3)    Shoulder shrug (C4)    Elbow flexion (C6)    Elbow ext  (C7)    Thumb ext (C8)    Finger abd (T1)    Grossly     (Blank rows = not tested, score listed is out of 5 possible points.  N = WNL, D = diminished, C = clear for gross weakness with myotome testing, * = concordant pain with testing)   UPPER EXTREMITY PROM:  PROM Right (Eval) Left (Eval)  Shoulder flexion    Shoulder abduction    Shoulder internal rotation    Shoulder external rotation    Functional IR    Functional ER    Shoulder extension    Elbow extension    Elbow flexion     (Blank rows = not tested, N = WNL, * = concordant pain with testing)  SPECIAL TESTS: Spurlings  (-)   JOINT MOBILITY TESTING:  na  PATIENT SURVEYS:  None taken    TODAY'S TREATMENT:  Therapeutic Exercise: Creating, reviewing, and completing below HEP   PATIENT EDUCATION ([place]/[facility]):  POC, diagnosis, prognosis, HEP, and outcome measures.  Pt educated via explanation, demonstration, and handout (HEP).  Pt confirms understanding verbally.  Discussed principles of lateral epicondylalgia rehab and expected time frame for rehab.  HOME EXERCISE PROGRAM: Access Code: [email or web address] URL: [email or web address] Date: 03/26/2023 Prepared by: Alphonzo Severance  Exercises - Seated Wrist Extension with Anchored Resistance  - 1 x daily - 7 x weekly - 1 sets - 3 reps - 1 minute hold  Treatment priorities   Eval                                                  ASSESSMENT:  CLINICAL IMPRESSION: [name] is a 53 y.o. female who presents to clinic with signs and sxs consistent with L lateral epicondylalgia.  Cx spine clear on exam today.  Sounds like she may have had some amount of strain of L shoulder but this is clear today with full ROM, strength, and minimal pain.  Provided HEP for tennis elbow.  She will continue to wear counter force brace.  She will follow up PRN.    OBJECTIVE IMPAIRMENTS: Pain  ACTIVITY LIMITATIONS: higher level upper body workout  PERSONAL FACTORS: See medical  history and pertinent history   REHAB POTENTIAL: Good  CLINICAL DECISION MAKING: Evolving/moderate complexity  EVALUATION COMPLEXITY: Low   GOALS:   1x visit   PLAN: 1x visit  PLANNED INTERVENTIONS:  97164- PT Re-evaluation, 97110-Therapeutic exercises, 97530- Therapeutic activity, [id number]- Neuromuscular re-education, 97535- Self Care, 91478- Manual therapy, [id number]- Gait training, [id number]- Aquatic Therapy, [id number]- Electrical stimulation (manual), [id number]- Vasopneumatic device, [id number]-  Traction (mechanical), 16109- Ionotophoresis 4mg /ml Dexamethasone, Taping, Dry Needling, Joint manipulation, and Spinal manipulation.   Alphonzo Severance PT, DPT 03/26/2023, 11:25 AM

## 2023-04-01 ENCOUNTER — Ambulatory Visit: Payer: No Typology Code available for payment source | Admitting: Family Medicine

## 2023-04-12 ENCOUNTER — Ambulatory Visit: Admitting: Family Medicine

## 2023-12-27 ENCOUNTER — Ambulatory Visit: Payer: Self-pay

## 2023-12-27 NOTE — Telephone Encounter (Signed)
 Pt calling for GI referral, states that her IBS has been worse within the last month and that she needs to establish with a new GI as her most recent has retired. Pt was scheduled for a new pt appt at her request, [facility]. Pt was advised to utilize UC for her current s/s-worsening abd pain that she states is her IBS. Pt was also advised that she may be able to reach out to her current PCP and cardiology for a referral as the cardiologist recommended that she see GI.  Copied from CRM #[id number]. Topic: Clinical - Red Word Triage >> Dec 27, 2023  1:54 PM Alexandria E wrote: [facility] that prompted transfer to Nurse Triage: Patient has a history of IBS, but is having worsening stomach pain, would like to establish care in order to receive gastro referral. Reason for Disposition  Abdominal pain is a chronic symptom (recurrent or ongoing AND present > 4 weeks)  Answer Assessment - Initial Assessment Questions 3. ONSET: When did the pain begin? (e.g., minutes, hours or days ago)      Ongoing, states worse over the last month 7. RECURRENT SYMPTOM: Have you ever had this type of stomach pain before? If Yes, ask: When was the last time? and What happened that time?      IBS 8. CAUSE: What do you think is causing the stomach pain? (e.g., gallstones, recent abdominal surgery)     IBS 9. RELIEVING/AGGRAVATING FACTORS: What makes it better or worse? (e.g., antacids, bending or twisting motion, bowel movement)     More stress lately 10. OTHER SYMPTOMS: Do you have any other symptoms? (e.g., back pain, diarrhea, fever, urination pain, vomiting)       Constipation, pain especially with palpation  Protocols used: Abdominal Pain - Female-A-AH

## 2024-05-05 ENCOUNTER — Ambulatory Visit: Admitting: Nurse Practitioner
